# Patient Record
Sex: Male | Born: 1980 | Hispanic: No | Marital: Single | Smoking: Never smoker
Health system: Southern US, Community
[De-identification: ages and names within clinical notes are randomized; demographics above are authoritative.]

---

## 2008-04-27 ENCOUNTER — Emergency Department (HOSPITAL_BASED_OUTPATIENT_CLINIC_OR_DEPARTMENT_OTHER): Admission: EM | Admit: 2008-04-27 | Discharge: 2008-04-27 | Payer: Self-pay | Admitting: Emergency Medicine

## 2008-07-30 ENCOUNTER — Emergency Department (HOSPITAL_BASED_OUTPATIENT_CLINIC_OR_DEPARTMENT_OTHER): Admission: EM | Admit: 2008-07-30 | Discharge: 2008-07-30 | Payer: Self-pay | Admitting: Emergency Medicine

## 2011-07-08 LAB — DIFFERENTIAL
Basophils Relative: 1
Eosinophils Absolute: 0
Eosinophils Relative: 0
Lymphocytes Relative: 7 — ABNORMAL LOW
Lymphs Abs: 1.3
Monocytes Relative: 5

## 2011-07-08 LAB — COMPREHENSIVE METABOLIC PANEL
ALT: 12
CO2: 27
Calcium: 9.7
Creatinine, Ser: 1.2
GFR calc Af Amer: >60
Glucose, Bld: 126 — ABNORMAL HIGH
Total Bilirubin: 0.7
Total Protein: 8.7 — ABNORMAL HIGH

## 2011-07-08 LAB — CBC
HCT: 46.6
MCHC: 33.3
RBC: 5.94 — ABNORMAL HIGH
RDW: 12.9
WBC: 19 — ABNORMAL HIGH

## 2011-07-08 LAB — LIPASE, BLOOD: Lipase: 28

## 2011-08-02 ENCOUNTER — Encounter: Payer: Self-pay | Admitting: *Deleted

## 2011-08-02 ENCOUNTER — Emergency Department (HOSPITAL_BASED_OUTPATIENT_CLINIC_OR_DEPARTMENT_OTHER)
Admission: EM | Admit: 2011-08-02 | Discharge: 2011-08-02 | Disposition: A | Discharge status: Home / Self Care | Payer: Self-pay | Attending: Emergency Medicine | Admitting: Emergency Medicine

## 2011-08-02 ENCOUNTER — Emergency Department (INDEPENDENT_AMBULATORY_CARE_PROVIDER_SITE_OTHER): Payer: Self-pay

## 2011-08-02 DIAGNOSIS — M79609 Pain in unspecified limb: Secondary | ICD-10-CM

## 2011-08-02 DIAGNOSIS — Y9369 Activity, other involving other sports and athletics played as a team or group: Secondary | ICD-10-CM | POA: Insufficient documentation

## 2011-08-02 DIAGNOSIS — S61412A Laceration without foreign body of left hand, initial encounter: Secondary | ICD-10-CM

## 2011-08-02 DIAGNOSIS — W219XXA Striking against or struck by unspecified sports equipment, initial encounter: Secondary | ICD-10-CM | POA: Insufficient documentation

## 2011-08-02 DIAGNOSIS — X58XXXA Exposure to other specified factors, initial encounter: Secondary | ICD-10-CM

## 2011-08-02 DIAGNOSIS — S61409A Unspecified open wound of unspecified hand, initial encounter: Secondary | ICD-10-CM

## 2011-08-02 MED ORDER — TETANUS-DIPHTH-ACELL PERTUSSIS 5-2.5-18.5 LF-MCG/0.5 IM SUSP
0.5000 mL | Freq: Once | INTRAMUSCULAR | Status: AC
  Administered 2011-08-02: 0.5 mL via INTRAMUSCULAR
  Filled 2011-08-02: qty 0.5

## 2011-08-02 MED ORDER — TETANUS-DIPHTH-ACELL PERTUSSIS 5-2.5-18.5 LF-MCG/0.5 IM SUSP: 0.5000 mL | Freq: Once | INTRAMUSCULAR | Status: DC

## 2011-08-02 MED ORDER — LIDOCAINE HCL 2 % IJ SOLN
INTRAMUSCULAR | Status: AC
  Administered 2011-08-02: 400 mg
  Filled 2011-08-02: qty 1

## 2011-08-02 MED ORDER — LIDOCAINE HCL 2 % IJ SOLN
20.0000 mL | Freq: Once | INTRAMUSCULAR | Status: AC
  Administered 2011-08-02: 400 mg

## 2011-08-02 MED ORDER — HYDROCODONE-ACETAMINOPHEN 5-325 MG PO TABS: 2.0000 | ORAL_TABLET | ORAL | Status: AC | PRN

## 2011-08-02 NOTE — Discharge Instructions (Signed)
Sutured Wound Care Sutures are stitches that can be used to close wounds. Wound care helps prevent pain and infection.  HOME CARE INSTRUCTIONS   Rest and elevate the injured area until all the pain and swelling are gone.   Only take over-the-counter or prescription medicines for pain, discomfort, or fever as directed by your caregiver.   After 48 hours, gently wash the area with mild soap and water once a day, or as directed. Rinse off the soap. Pat the area dry with a clean towel. Do not rub the wound. This may cause bleeding.   Follow your caregiver's instructions for how often to change the bandage (dressing). Stop using a dressing after 2 days or after the wound stops draining.   If the dressing sticks, moisten it with soapy water and gently remove it.   Apply ointment on the wound as directed.   Avoid stretching a sutured wound.   Drink enough fluids to keep your urine clear or pale yellow.   Follow up with your caregiver for suture removal as directed.   Use sunscreen on your wound for the next 3 to 6 months so the scar will not darken.  SEEK IMMEDIATE MEDICAL CARE IF:   Your wound becomes red, swollen, hot, or tender.   You have increasing pain in the wound.   You have a red streak that extends from the wound.   There is pus coming from the wound.   You have a fever.   You have shaking chills.   There is a bad smell coming from the wound.   You have persistent bleeding from the wound.  MAKE SURE YOU:   Understand these instructions.   Will watch your condition.   Will get help right away if you are not doing well or get worse.  Document Released: 11/03/2004 Document Revised: 06/08/2011 Document Reviewed: 01/30/2011 [facility] Patient Information 2012 [place], [place].

## 2011-08-02 NOTE — ED Provider Notes (Signed)
History     CSN: [id number] Arrival date & time: 08/02/2011  5:53 PM   First MD Initiated Contact with Patient 08/02/11 1817      Chief Complaint  Patient presents with  . Laceration    (Consider location/radiation/quality/duration/timing/severity/associated sxs/prior treatment) Patient is a 30 y.o. male presenting with skin laceration. The history is provided by the patient. No language interpreter was used.  Laceration  The incident occurred 6 to 12 hours ago. The laceration is located on the left hand. The laceration is 2 cm in size. The laceration mechanism is unknown.The pain is at a severity of 5/10. The pain is mild. The pain has been constant since onset. He reports no foreign bodies present. His tetanus status is out of date.  Pt was hit with a cricket ball,  Pt complains of laeration to hand.  History reviewed. No pertinent past medical history.  History reviewed. No pertinent past surgical history.  History reviewed. No pertinent family history.  History  Substance Use Topics  . Smoking status: Never Smoker   . Smokeless tobacco: Not on file  . Alcohol Use: No      Review of Systems  Skin: Positive for wound.  All other systems reviewed and are negative.    Allergies  Review of patient's allergies indicates no known allergies.  Home Medications  No current outpatient prescriptions on file.  BP 113/84  Pulse 109  Temp(Src) 98.4 F (36.9 C) (Oral)  Resp 16  Ht 5\' 4"  (1.626 m)  Wt 150 lb (68.04 kg)  BMI 25.75 kg/m2  SpO2 100%  Physical Exam  Nursing note and vitals reviewed. Constitutional: He is oriented to person, place, and time. He appears well-developed and well-nourished.  HENT:  Head: Normocephalic and atraumatic.  Musculoskeletal:       2cm laceration between 2nd and 3rd finger  Neurological: He is alert and oriented to person, place, and time.  Skin: Skin is warm.  Psychiatric: He has a normal mood and affect.    ED Course  Wound  repair Date/Time: 08/02/2011 8:17 PM Performed by: Langston Masker Authorized by: Langston Masker Consent: Verbal consent obtained. Consent given by: patient Patient understanding: patient states understanding of the procedure being performed Patient identity confirmed: verbally with patient Time out: Immediately prior to procedure a "time out" was called to verify the correct patient, procedure, equipment, support staff and site/side marked as required. Local anesthesia used: yes Anesthesia: local infiltration Local anesthetic: lidocaine 1% without epinephrine Anesthetic total: 5 ml Patient sedated: no Patient tolerance: Patient tolerated the procedure well with no immediate complications.  LACERATION REPAIR Date/Time: 08/02/2011 8:20 PM Performed by: Langston Masker Authorized by: Langston Masker Consent: Verbal consent obtained. Risks and benefits: risks, benefits and alternatives were discussed Consent given by: patient Patient understanding: patient states understanding of the procedure being performed Patient identity confirmed: verbally with patient Time out: Immediately prior to procedure a "time out" was called to verify the correct patient, procedure, equipment, support staff and site/side marked as required. Body area: upper extremity Location details: left hand Laceration length: 2 cm Tendon involvement: none Nerve involvement: none Anesthesia: local infiltration Local anesthetic: lidocaine 1% without epinephrine Anesthetic total: 5 ml Patient sedated: no Preparation: Patient was prepped and draped in the usual sterile fashion. Irrigation solution: saline Amount of cleaning: standard Debridement: none Degree of undermining: none Skin closure: 5-0 nylon Number of sutures: 5 Technique: simple Approximation: loose Approximation difficulty: simple Patient tolerance: Patient tolerated the procedure well with no immediate  complications.   (including critical care  time)  Labs Reviewed - No data to display Dg Hand Complete Left  08/02/2011  *RADIOLOGY REPORT*  Clinical Data: Laceration to left hand between index and third finger.  LEFT HAND - COMPLETE 3+ VIEW  Comparison: None.  Findings: Soft tissue injury about the proximal aspect of the third digit. No radio-opaque foreign body.    No acute fracture or dislocation.  Mild overlap of fingers on the lateral view.  IMPRESSION: Soft tissue injury, without acute osseous abnormality.  Original Report Authenticated By: Consuello Bossier, M.D.     No diagnosis found.    MDM  Pt counseled on suture removal.        Langston Masker, PA 08/02/11 2022  Langston Masker, [place] 08/02/11 2023

## 2011-08-02 NOTE — ED Notes (Signed)
Wound cleaned, bacitracin applied then covered with non-adhering gauze and 4x4's then wrapped with kerlex.

## 2011-08-02 NOTE — ED Notes (Signed)
Pt states laceration to left hand b/w index and 3rd finger  By baseball

## 2011-08-05 NOTE — ED Provider Notes (Signed)
Medical screening examination/treatment/procedure(s) were performed by non-physician practitioner and as supervising physician I was immediately available for consultation/collaboration.  Toy Baker, MD 08/05/11 0930

## 2011-08-05 NOTE — ED Provider Notes (Signed)
Medical screening examination/treatment/procedure(s) were performed by non-physician practitioner and as supervising physician I was immediately available for consultation/collaboration.  Toy Baker, MD 08/05/11 [phone]

## 2011-12-19 ENCOUNTER — Emergency Department (HOSPITAL_BASED_OUTPATIENT_CLINIC_OR_DEPARTMENT_OTHER)
Admission: EM | Admit: 2011-12-19 | Discharge: 2011-12-19 | Disposition: A | Discharge status: Home / Self Care | Payer: Self-pay | Attending: Emergency Medicine | Admitting: Emergency Medicine

## 2011-12-19 ENCOUNTER — Encounter (HOSPITAL_BASED_OUTPATIENT_CLINIC_OR_DEPARTMENT_OTHER): Payer: Self-pay

## 2011-12-19 DIAGNOSIS — R05 Cough: Secondary | ICD-10-CM | POA: Insufficient documentation

## 2011-12-19 DIAGNOSIS — R059 Cough, unspecified: Secondary | ICD-10-CM | POA: Insufficient documentation

## 2011-12-19 DIAGNOSIS — J069 Acute upper respiratory infection, unspecified: Secondary | ICD-10-CM

## 2011-12-19 DIAGNOSIS — R509 Fever, unspecified: Secondary | ICD-10-CM | POA: Insufficient documentation

## 2011-12-19 NOTE — ED Provider Notes (Signed)
History     CSN: [id number]  Arrival date & time 12/19/11  1904   First MD Initiated Contact with Patient 12/19/11 1932      Chief Complaint  Patient presents with  . Fever  . Cough     HPI   Philip Burke is a 31 y.o. male who presents to the Emergency Department complaining of sudden onset, persistence of constant, gradually worsening moderate non-prod  cough onset yesterday. Pt c/o associated subjective fever, temp is currently 98.7.   Pt c/o associated mild sore throat and nasal congestion. Pt denies taking any medications to treat sx. Denies vomiting, diarrhea. There are no other associated symptoms and no other alleviating or aggravating factors.  Pt denies any recent foreign travel   PMH - none  History reviewed. No pertinent past surgical history.  No family history on file.  History  Substance Use Topics  . Smoking status: Never Smoker   . Smokeless tobacco: Not on file  . Alcohol Use: No      Review of Systems  HENT: Positive for congestion, sore throat and rhinorrhea.   Respiratory: Positive for cough.   Gastrointestinal: Negative for nausea, vomiting and diarrhea.    Allergies  Review of patient's allergies indicates no known allergies.  Home Medications  No current outpatient prescriptions on file.  BP 121/74  Pulse 82  Temp(Src) 98.7 F (37.1 C) (Oral)  Resp 16  Ht 5\' 6"  (1.676 m)  Wt 120 lb (54.432 kg)  BMI 19.37 kg/m2  SpO2 98%  Physical Exam CONSTITUTIONAL: Well developed/well nourished HEAD AND FACE: Normocephalic/atraumatic EYES: EOMI/PERRL ENMT: Mucous membranes moist, uvula midline, pharynx erythematous NECK: supple no meningeal signs CV: S1/S2 noted, no murmurs/rubs/gallops noted LUNGS: Lungs are clear to auscultation bilaterally, no apparent distress, pharynx erythematous  ABDOMEN: soft, nontender, no rebound or guarding GU:no cva tenderness NEURO: Pt is awake/alert, moves all extremitiesx4 EXTREMITIES: pulses normal, full  ROM SKIN: warm, color normal PSYCH: no abnormalities of mood noted  ED Course  Procedures DIAGNOSTIC STUDIES: Oxygen Saturation is 98% on room air, normal by my interpretation.    COORDINATION OF CARE:    Pt well appearing, no distress Suspect viral process  The patient appears reasonably screened and/or stabilized for discharge and I doubt any other medical condition or other [facility] requiring further screening, evaluation, or treatment in the ED at this time prior to discharge.    MDM  Nursing notes reviewed and considered in documentation   I personally performed the services described in this documentation, which was scribed in my presence. The recorded information has been reviewed and considered.       Joya Gaskins, MD 12/19/11 2019

## 2011-12-19 NOTE — Discharge Instructions (Signed)
Antibiotic Nonuse  Your caregiver felt that the infection or problem was not one that would be helped with an antibiotic. Infections may be caused by viruses or bacteria. Only a caregiver can tell which one of these is the likely cause of an illness. A cold is the most common cause of infection in both adults and children. A cold is a virus. Antibiotic treatment will have no effect on a viral infection. Viruses can lead to many lost days of work caring for sick children and many missed days of school. Children may catch as many as 10 "colds" or "flus" per year during which they can be tearful, cranky, and uncomfortable. The goal of treating a virus is aimed at keeping the ill person comfortable. Antibiotics are medications used to help the body fight bacterial infections. There are relatively few types of bacteria that cause infections but there are hundreds of viruses. While both viruses and bacteria cause infection they are very different types of germs. A viral infection will typically go away by itself within 7 to 10 days. Bacterial infections may spread or get worse without antibiotic treatment. Examples of bacterial infections are:  Sore throats (like strep throat or tonsillitis).   Infection in the lung (pneumonia).   Ear and skin infections.  Examples of viral infections are:  Colds or flus.   Most coughs and bronchitis.   Sore throats not caused by Strep.   Runny noses.  It is often best not to take an antibiotic when a viral infection is the cause of the problem. Antibiotics can kill off the helpful bacteria that we have inside our body and allow harmful bacteria to start growing. Antibiotics can cause side effects such as allergies, nausea, and diarrhea without helping to improve the symptoms of the viral infection. Additionally, repeated uses of antibiotics can cause bacteria inside of our body to become resistant. That resistance can be passed onto harmful bacterial. The next time  you have an infection it may be harder to treat if antibiotics are used when they are not needed. Not treating with antibiotics allows our own immune system to develop and take care of infections more efficiently. Also, antibiotics will work better for us when they are prescribed for bacterial infections. Treatments for a child that is ill may include:  Give extra fluids throughout the day to stay hydrated.   Get plenty of rest.   Only give your child over-the-counter or prescription medicines for pain, discomfort, or fever as directed by your caregiver.   The use of a cool mist humidifier may help stuffy noses.   Cold medications if suggested by your caregiver.  Your caregiver may decide to start you on an antibiotic if:  The problem you were seen for today continues for a longer length of time than expected.   You develop a secondary bacterial infection.  SEEK MEDICAL CARE IF:  Fever lasts longer than 5 days.   Symptoms continue to get worse after 5 to 7 days or become severe.   Difficulty in breathing develops.   Signs of dehydration develop (poor drinking, rare urinating, dark colored urine).   Changes in behavior or worsening tiredness (listlessness or lethargy).  Document Released: 12/05/2001 Document Revised: 09/15/2011 Document Reviewed: 06/03/2009 [employer] Patient Information 2012 [employer]. 

## 2011-12-19 NOTE — ED Notes (Signed)
Fever, nonprod cough-started yesterday-NAD

## 2013-12-31 ENCOUNTER — Emergency Department (HOSPITAL_BASED_OUTPATIENT_CLINIC_OR_DEPARTMENT_OTHER)
Admission: EM | Admit: 2013-12-31 | Discharge: 2013-12-31 | Disposition: A | Discharge status: Home / Self Care | Payer: Self-pay | Attending: Emergency Medicine | Admitting: Emergency Medicine

## 2013-12-31 ENCOUNTER — Encounter (HOSPITAL_BASED_OUTPATIENT_CLINIC_OR_DEPARTMENT_OTHER): Payer: Self-pay | Admitting: Emergency Medicine

## 2013-12-31 DIAGNOSIS — W219XXA Striking against or struck by unspecified sports equipment, initial encounter: Secondary | ICD-10-CM | POA: Insufficient documentation

## 2013-12-31 DIAGNOSIS — S01511A Laceration without foreign body of lip, initial encounter: Secondary | ICD-10-CM

## 2013-12-31 DIAGNOSIS — Y9369 Activity, other involving other sports and athletics played as a team or group: Secondary | ICD-10-CM | POA: Insufficient documentation

## 2013-12-31 DIAGNOSIS — Y9239 Other specified sports and athletic area as the place of occurrence of the external cause: Secondary | ICD-10-CM | POA: Insufficient documentation

## 2013-12-31 DIAGNOSIS — Y92838 Other recreation area as the place of occurrence of the external cause: Secondary | ICD-10-CM

## 2013-12-31 DIAGNOSIS — S01501A Unspecified open wound of lip, initial encounter: Secondary | ICD-10-CM | POA: Insufficient documentation

## 2013-12-31 MED ORDER — AMOXICILLIN 500 MG PO CAPS: 500.0000 mg | ORAL_CAPSULE | Freq: Three times a day (TID) | ORAL | Status: DC

## 2013-12-31 NOTE — ED Provider Notes (Signed)
CSN: [id number]     Arrival date & time 12/31/13  1819 History   First MD Initiated Contact with Patient 12/31/13 1825     Chief Complaint  Patient presents with  . Lip Laceration     (Consider location/radiation/quality/duration/timing/severity/associated sxs/prior Treatment) HPI Comments: Pt was playing cricket a  Short time ago and the ball hit him in the lip and he has a laceration. Pt denies problems with opening and closing mouth. Tetanus is utd. Denies any dental problem  The history is provided by the patient. No language interpreter was used.    History reviewed. No pertinent past medical history. History reviewed. No pertinent past surgical history. No family history on file. History  Substance Use Topics  . Smoking status: Never Smoker   . Smokeless tobacco: Not on file  . Alcohol Use: No    Review of Systems  Constitutional: Negative.   Respiratory: Negative.   Cardiovascular: Negative.       Allergies  Review of patient's allergies indicates no known allergies.  Home Medications  No current outpatient prescriptions on file. BP 119/70  Pulse 96  Temp(Src) 98.4 F (36.9 C) (Oral)  Resp 18  Ht 5\' 8"  (1.727 m)  Wt 120 lb (54.432 kg)  BMI 18.25 kg/m2  SpO2 98% Physical Exam  Nursing note and vitals reviewed. Constitutional: He is oriented to person, place, and time. He appears well-developed and well-nourished.  HENT:  Right Ear: External ear normal.  Left Ear: External ear normal.  Pt no swelling or tenderness noted to the jaw.pt opening and closing without any problem:dentition intact  Cardiovascular: Normal rate and regular rhythm.   Pulmonary/Chest: Effort normal and breath sounds normal.  Musculoskeletal: Normal range of motion.  Neurological: He is alert and oriented to person, place, and time.  Skin:  Laceration noted to lower lip that is irregular    ED Course  LACERATION REPAIR Date/Time: 12/31/2013 7:01 PM Performed by: Teressa LowerPICKERING,  Juanantonio Stolar Authorized by: Teressa LowerPICKERING, Shali Vesey Consent: Verbal consent obtained. Risks and benefits: risks, benefits and alternatives were discussed Consent given by: patient Patient identity confirmed: verbally with patient Body area: mouth (lower lip partially outer and partial inner mucosa) Laceration length: 2 cm Foreign bodies: no foreign bodies Anesthesia: local infiltration Local anesthetic: lidocaine 2% without epinephrine Irrigation solution: saline Irrigation method: syringe Amount of cleaning: standard Skin closure: Ethilon Number of sutures: 6 Technique: simple Approximation: close Approximation difficulty: complex Patient tolerance: Patient tolerated the procedure well with no immediate complications.   (including critical care time) Labs Review Labs Reviewed - No data to display Imaging Review No results found.   EKG Interpretation None      MDM   Final diagnoses:  Lip laceration    Wound closed. Some sutures put on the inside do to complexity of the wound. Pt placed on antibiotics for risk of infection    Teressa LowerVrinda Wynston Romey, NP 12/31/13 1904

## 2013-12-31 NOTE — ED Notes (Signed)
Pt playing cricket, hit by a ball in the mouth, sustained lip laceration.  No loc.

## 2013-12-31 NOTE — ED Notes (Signed)
Suture cart to bedside. 

## 2013-12-31 NOTE — ED Provider Notes (Signed)
Medical screening examination/treatment/procedure(s) were performed by non-physician practitioner and as supervising physician I was immediately available for consultation/collaboration.   EKG Interpretation None        Philip B. Bernette MayersSheldon, MD 12/31/13 2030

## 2013-12-31 NOTE — Discharge Instructions (Signed)
Follow up for any sign of infection Facial Laceration  A facial laceration is a cut on the face. These injuries can be painful and cause bleeding. Lacerations usually heal quickly, but they need special care to reduce scarring. DIAGNOSIS  Your health care provider will take a medical history, ask for details about how the injury occurred, and examine the wound to determine how deep the cut is. TREATMENT  Some facial lacerations may not require closure. Others may not be able to be closed because of an increased risk of infection. The risk of infection and the chance for successful closure will depend on various factors, including the amount of time since the injury occurred. The wound may be cleaned to help prevent infection. If closure is appropriate, pain medicines may be given if needed. Your health care provider will use stitches (sutures), wound glue (adhesive), or skin adhesive strips to repair the laceration. These tools bring the skin edges together to allow for faster healing and a better cosmetic outcome. If needed, you may also be given a tetanus shot. HOME CARE INSTRUCTIONS  Only take over-the-counter or prescription medicines as directed by your health care provider.  Follow your health care provider's instructions for wound care. These instructions will vary depending on the technique used for closing the wound. For Sutures:  Keep the wound clean and dry.   If you were given a bandage (dressing), you should change it at least once a day. Also change the dressing if it becomes wet or dirty, or as directed by your health care provider.   Wash the wound with soap and water 2 times a day. Rinse the wound off with water to remove all soap. Pat the wound dry with a clean towel.   After cleaning, apply a thin layer of the antibiotic ointment recommended by your health care provider. This will help prevent infection and keep the dressing from sticking.   You may shower as usual after  the first 24 hours. Do not soak the wound in water until the sutures are removed.   Get your sutures removed as directed by your health care provider. With facial lacerations, sutures should usually be taken out after 4 5 days to avoid stitch marks.   Wait a few days after your sutures are removed before applying any makeup. For Skin Adhesive Strips:  Keep the wound clean and dry.   Do not get the skin adhesive strips wet. You may bathe carefully, using caution to keep the wound dry.   If the wound gets wet, pat it dry with a clean towel.   Skin adhesive strips will fall off on their own. You may trim the strips as the wound heals. Do not remove skin adhesive strips that are still stuck to the wound. They will fall off in time.  For Wound Adhesive:  You may briefly wet your wound in the shower or bath. Do not soak or scrub the wound. Do not swim. Avoid periods of heavy sweating until the skin adhesive has fallen off on its own. After showering or bathing, gently pat the wound dry with a clean towel.   Do not apply liquid medicine, cream medicine, ointment medicine, or makeup to your wound while the skin adhesive is in place. This may loosen the film before your wound is healed.   If a dressing is placed over the wound, be careful not to apply tape directly over the skin adhesive. This may cause the adhesive to be pulled off  before the wound is healed.   Avoid prolonged exposure to sunlight or tanning lamps while the skin adhesive is in place.  The skin adhesive will usually remain in place for 5 10 days, then naturally fall off the skin. Do not pick at the adhesive film.  After Healing: Once the wound has healed, cover the wound with sunscreen during the day for 1 full year. This can help minimize scarring. Exposure to ultraviolet light in the first year will darken the scar. It can take 1 2 years for the scar to lose its redness and to heal completely.  SEEK IMMEDIATE MEDICAL  CARE IF:  You have redness, pain, or swelling around the wound.   You see ayellowish-white fluid (pus) coming from the wound.   You have chills or a fever.  MAKE SURE YOU:  Understand these instructions.  Will watch your condition.  Will get help right away if you are not doing well or get worse. Document Released: 11/03/2004 Document Revised: 07/17/2013 Document Reviewed: 05/09/2013 [facility] Patient Information 2014 [place] Name[employer], [place].  Absorbable Suture Repair Absorbable sutures (stitches) hold skin together so you can heal. Keep skin wounds clean and dry for the next 2 to 3 days. Then, you may gently wash your wound and dress it with an antibiotic ointment as recommended. As your wound begins to heal, the sutures are no longer needed, and they typically begin to fall off. This will take 7 to 10 days. After 10 days, if your sutures are loose, you can remove them by wiping with a clean gauze pad or a cotton ball. Do not pull your sutures out. They should wipe away easily. If after 10 days they do not easily wipe away, have your caregiver take them out. Absorbable sutures may be used deep in a wound to help hold it together. If these stitches are below the skin, the body will absorb them completely in 3 to 4 weeks.  You may need a tetanus shot if:  You cannot remember when you had your last tetanus shot.  You have never had a tetanus shot. If you get a tetanus shot, your arm may swell, get red, and feel warm to the touch. This is common and not a problem. If you need a tetanus shot and you choose not to have one, there is a rare chance of getting tetanus. Sickness from tetanus can be serious. SEEK IMMEDIATE MEDICAL CARE IF:  You have redness in the wound area.  The wound area feels hot to the touch.  You develop swelling in the wound area.  You develop pain.  There is fluid drainage from the wound. Document Released: 11/03/2004 Document Revised: 12/19/2011 Document Reviewed:  02/15/2011 [facility] Patient Information 2014 [place]Care, [place].

## 2014-04-30 ENCOUNTER — Emergency Department (HOSPITAL_BASED_OUTPATIENT_CLINIC_OR_DEPARTMENT_OTHER)
Admission: EM | Admit: 2014-04-30 | Discharge: 2014-04-30 | Disposition: A | Discharge status: Home / Self Care | Payer: Self-pay | Attending: Emergency Medicine | Admitting: Emergency Medicine

## 2014-04-30 ENCOUNTER — Encounter (HOSPITAL_BASED_OUTPATIENT_CLINIC_OR_DEPARTMENT_OTHER): Payer: Self-pay | Admitting: Emergency Medicine

## 2014-04-30 ENCOUNTER — Emergency Department (HOSPITAL_BASED_OUTPATIENT_CLINIC_OR_DEPARTMENT_OTHER): Payer: Self-pay

## 2014-04-30 DIAGNOSIS — S63259A Unspecified dislocation of unspecified finger, initial encounter: Secondary | ICD-10-CM | POA: Insufficient documentation

## 2014-04-30 DIAGNOSIS — W219XXA Striking against or struck by unspecified sports equipment, initial encounter: Secondary | ICD-10-CM | POA: Insufficient documentation

## 2014-04-30 DIAGNOSIS — S63105A Unspecified dislocation of left thumb, initial encounter: Secondary | ICD-10-CM

## 2014-04-30 DIAGNOSIS — Y9289 Other specified places as the place of occurrence of the external cause: Secondary | ICD-10-CM | POA: Insufficient documentation

## 2014-04-30 DIAGNOSIS — Y9389 Activity, other specified: Secondary | ICD-10-CM | POA: Insufficient documentation

## 2014-04-30 MED ORDER — HYDROCODONE-ACETAMINOPHEN 5-325 MG PO TABS: 2.0000 | ORAL_TABLET | ORAL | Status: DC | PRN

## 2014-04-30 MED ORDER — LIDOCAINE HCL 2 % IJ SOLN
INTRAMUSCULAR | Status: AC
  Administered 2014-04-30: 21:00:00
  Filled 2014-04-30: qty 20

## 2014-04-30 MED ORDER — HYDROCODONE-ACETAMINOPHEN 5-325 MG PO TABS
2.0000 | ORAL_TABLET | Freq: Once | ORAL | Status: AC
  Administered 2014-04-30: 2 via ORAL
  Filled 2014-04-30: qty 2

## 2014-04-30 NOTE — ED Notes (Signed)
Pt c/o injury to left thumb x 1 hr ago while playing ball. deformity noted

## 2014-04-30 NOTE — ED Provider Notes (Signed)
CSN: [id number]     Arrival date & time 04/30/14  1924 History   First MD Initiated Contact with Patient 04/30/14 2040     Chief Complaint  Patient presents with  . Finger Injury     (Consider location/radiation/quality/duration/timing/severity/associated sxs/prior Treatment) HPI Comments: Patient is a 33 year old male who presents with left thumb pain that started 1 hour ago while playing cricket. Patient reports trying to catch the ball when it hit his thumb. He reports sudden onset of throbbing and severe pain that did not radiate. Palpation makes the pain worse. No alleviating factors. Patient denies any other injuries or symptoms.    History reviewed. No pertinent past medical history. History reviewed. No pertinent past surgical history. History reviewed. No pertinent family history. History  Substance Use Topics  . Smoking status: Never Smoker   . Smokeless tobacco: Not on file  . Alcohol Use: No    Review of Systems  Constitutional: Negative for fever, chills and fatigue.  HENT: Negative for trouble swallowing.   Eyes: Negative for visual disturbance.  Respiratory: Negative for shortness of breath.   Cardiovascular: Negative for chest pain and palpitations.  Gastrointestinal: Negative for nausea, vomiting, abdominal pain and diarrhea.  Genitourinary: Negative for dysuria and difficulty urinating.  Musculoskeletal: Positive for arthralgias. Negative for neck pain.  Skin: Negative for color change.  Neurological: Negative for dizziness and weakness.  Psychiatric/Behavioral: Negative for dysphoric mood.      Allergies  Review of patient's allergies indicates no known allergies.  Home Medications   Prior to Admission medications   Not on File   BP 122/85  Pulse 88  Temp(Src) 98.9 F (37.2 C) (Oral)  Resp 18  Ht 5\' 8"  (1.727 m)  Wt 121 lb (54.885 kg)  BMI 18.40 kg/m2  SpO2 100% Physical Exam  Nursing note and vitals reviewed. Constitutional: He is oriented  to person, place, and time. He appears well-developed and well-nourished. No distress.  HENT:  Head: Normocephalic and atraumatic.  Eyes: Conjunctivae are normal.  Neck: Normal range of motion.  Cardiovascular: Normal rate and regular rhythm.  Exam reveals no gallop and no friction rub.   No murmur heard. Pulmonary/Chest: Effort normal and breath sounds normal. He has no wheezes. He has no rales. He exhibits no tenderness.  Musculoskeletal:  Obvious deformity of DIP of left thumb. Limited ROM of left thumb due to deformity and pain.   Neurological: He is alert and oriented to person, place, and time. Coordination normal.  Speech is goal-oriented. Moves limbs without ataxia.   Skin: Skin is warm and dry.  Psychiatric: He has a normal mood and affect. His behavior is normal.    ED Course  NERVE BLOCK Date/Time: 04/30/2014 9:00 PM Performed by: Emilia BeckSZEKALSKI, Thao Vanover Authorized by: Emilia BeckSZEKALSKI, Tyrea Froberg Consent: Verbal consent obtained. Consent given by: patient Patient understanding: patient states understanding of the procedure being performed Patient consent: the patient's understanding of the procedure matches consent given Patient identity confirmed: verbally with patient Indications: dislocation Body area: upper extremity Nerve: digital Laterality: left Patient sedated: no Patient position: sitting Needle gauge: 27 G Location technique: anatomical landmarks Local anesthetic: lidocaine 2% without epinephrine Anesthetic total: 3 ml Outcome: pain improved Patient tolerance: Patient tolerated the procedure well with no immediate complications.  Reduction of dislocation Date/Time: 04/30/2014 9:01 PM Performed by: Emilia BeckSZEKALSKI, Eliasar Hlavaty Authorized by: Emilia BeckSZEKALSKI, Jozsef Wescoat Consent: Verbal consent obtained. Risks and benefits: risks, benefits and alternatives were discussed Consent given by: patient Patient understanding: patient states understanding of the  procedure being  performed Patient consent: the patient's understanding of the procedure matches consent given Imaging studies: imaging studies available Patient identity confirmed: verbally with patient Local anesthesia used: yes Anesthesia: digital block Local anesthetic: lidocaine 2% without epinephrine Anesthetic total: 3 ml Patient sedated: no Patient tolerance: Patient tolerated the procedure well with no immediate complications.   (including critical care time) Labs Review Labs Reviewed - No data to display  SPLINT APPLICATION Date/Time: 9:28 PM Authorized by: Emilia Beck Consent: Verbal consent obtained. Risks and benefits: risks, benefits and alternatives were discussed Consent given by: patient Splint applied by: EMT Location details: left thumb Splint type: static finger splint Supplies used: static finger splint Post-procedure: The splinted body part was neurovascularly unchanged following the procedure. Patient tolerance: Patient tolerated the procedure well with no immediate complications.     Imaging Review Dg Finger Thumb Left  04/30/2014   CLINICAL DATA:  Postreduction.  EXAM: LEFT THUMB 2+V  COMPARISON:  Earlier the same day  FINDINGS: Be IP joint dislocation seen on the previous study is been reduced in the interval. No fracture is evident on the current exam.  IMPRESSION: Interval reduction of the IP joint dislocation.   Electronically Signed   By: Kennith Center M.D.   On: 04/30/2014 21:15   Dg Finger Thumb Left  04/30/2014   CLINICAL DATA:  Left thumb injury, pain.  EXAM: LEFT THUMB 2+V  COMPARISON:  None.  FINDINGS: The distal phalanx of the left thumb is dorsally dislocated. No fracture is identified. Soft tissue swelling is noted.  IMPRESSION: Dorsal dislocation distal phalanx left thumb.   Electronically Signed   By: Drusilla Kanner M.D.   On: 04/30/2014 20:11     EKG Interpretation None      MDM   Final diagnoses:  Thumb dislocation, left, initial  encounter    8:57 PM Patient's dislocation reduced. Will get xray to confirm. Patient will have vicodin for pain and finger splint.   9:27 PM Dislocation reduced without difficulty. No neurovascular compromise. Patient will have splint and be discharged with hand surgery follow up.   Emilia Beck, PA-C 04/30/14 2132

## 2014-04-30 NOTE — Discharge Instructions (Signed)
Wear finger splint until follow up with hand surgeon. Call Dr. Carlos LeveringGramig's office to make an appointment. Take Vicodin as needed for pain. Refer to attached documents for more information.

## 2014-04-30 NOTE — ED Provider Notes (Signed)
Medical screening examination/treatment/procedure(s) were performed by non-physician practitioner and as supervising physician I was immediately available for consultation/collaboration.   EKG Interpretation None        William Lateria Alderman, MD 04/30/14 2331 

## 2015-11-19 ENCOUNTER — Emergency Department (HOSPITAL_BASED_OUTPATIENT_CLINIC_OR_DEPARTMENT_OTHER): Payer: Self-pay

## 2015-11-19 ENCOUNTER — Encounter (HOSPITAL_BASED_OUTPATIENT_CLINIC_OR_DEPARTMENT_OTHER): Payer: Self-pay | Admitting: *Deleted

## 2015-11-19 ENCOUNTER — Emergency Department (HOSPITAL_BASED_OUTPATIENT_CLINIC_OR_DEPARTMENT_OTHER)
Admission: EM | Admit: 2015-11-19 | Discharge: 2015-11-20 | Disposition: A | Discharge status: Home / Self Care | Payer: Self-pay | Attending: Emergency Medicine | Admitting: Emergency Medicine

## 2015-11-19 DIAGNOSIS — B349 Viral infection, unspecified: Secondary | ICD-10-CM | POA: Insufficient documentation

## 2015-11-19 MED ORDER — ACETAMINOPHEN 325 MG PO TABS
650.0000 mg | ORAL_TABLET | Freq: Once | ORAL | Status: AC
  Administered 2015-11-19: 650 mg via ORAL
  Filled 2015-11-19: qty 2

## 2015-11-19 NOTE — ED Notes (Signed)
Cough, fever, headache and runny nose today.

## 2015-11-19 NOTE — ED Notes (Signed)
Pt c/o cough, congestion and fever that started today, has not taken any medication for symptoms

## 2015-11-20 MED ORDER — BENZONATATE 100 MG PO CAPS: 100.0000 mg | ORAL_CAPSULE | Freq: Three times a day (TID) | ORAL | Status: DC

## 2015-11-20 MED ORDER — IBUPROFEN 800 MG PO TABS: 800.0000 mg | ORAL_TABLET | Freq: Three times a day (TID) | ORAL | Status: DC | PRN

## 2015-11-20 NOTE — ED Notes (Signed)
Pt verbalizes understanding of d/c instructions and denies any further needs at this time. 

## 2015-11-20 NOTE — Discharge Instructions (Signed)
1. Medications: tessalon as needed for cough, ibuprofen as needed for fever (you can alternate with tylenol as well for fever control), continue usual home medications 2. Treatment: rest, drink plenty of fluids 3. Follow Up: Please follow up with your primary doctor in 3 days for discussion of your diagnoses and further evaluation after today's visit; if you do not have a primary care doctor use the resource guide provided to find one; Return to the ER for high fevers, difficulty breathing or other concerning symptoms   Emergency Department Resource Guide 1) Find a Doctor and Pay Out of Pocket Although you won't have to find out who is covered by your insurance plan, it is a good idea to ask around and get recommendations. You will then need to call the office and see if the doctor you have chosen will accept you as a new patient and what types of options they offer for patients who are self-pay. Some doctors offer discounts or will set up payment plans for their patients who do not have insurance, but you will need to ask so you aren't surprised when you get to your appointment.  2) Contact Your Local Health Department Not all health departments have doctors that can see patients for sick visits, but many do, so it is worth a call to see if yours does. If you don't know where your local health department is, you can check in your phone book. The CDC also has a tool to help you locate your state's health department, and many state websites also have listings of all of their local health departments.  3) Find a Walk-in Clinic If your illness is not likely to be very severe or complicated, you may want to try a walk in clinic. These are popping up all over the country in pharmacies, drugstores, and shopping centers. They're usually staffed by nurse practitioners or physician assistants that have been trained to treat common illnesses and complaints. They're usually fairly quick and inexpensive. However,  if you have serious medical issues or chronic medical problems, these are probably not your best option.  No Primary Care Doctor: - Call Health Connect at  [phone] - they can help you locate a primary care doctor that  accepts your insurance, provides certain services, etc. - Physician Referral Service- [phone]  Chronic Pain Problems: Organization         Address  Phone   Notes  [facility]  [phone] Patients need to be referred by their primary care doctor.   Medication Assistance: Organization         Address  Phone   Notes  [facility] [address] [place] [place], [place] [address] [phone] --Must be a resident of [facility] -- Must have NO insurance coverage whatsoever (no Medicaid/ Medicare, etc.) -- The pt. MUST have a primary care doctor that directs their care regularly and follows them in the community   [facility]  [phone]   [employer]  [phone]    Agencies that provide inexpensive medical care: Organization         Address  Phone   Notes  [facility]  [phone]   [facility]    [phone]   [facility] [address] [place], [place] [address] [phone]   [facility] [address], [place] [phone]   [facility]    [phone]   [facility]    [phone]   [facility]  [address], [place] Phone:  [phone], Fax:  [phone] Hours of Operation:  9 am - 6 pm, M-F.  Also accepts Medicaid/Medicare and self-pay.  [facility]  [address], [place] Phone: [phone], Fax: [phone]. Hours of Operation:  8:30 am - 5:30 pm, M-F.  Also accepts Medicaid and self-pay.  [facility] [place] [address] [place] Phone: [phone]   [facility] [address] Natasha Bence [place], [place] [phone], Ext. [phone] Mondays & Thursdays: 7-9 AM.  First 15 patients are seen on a first come, first serve basis.    Medicaid-accepting [facility] Providers:  Organization         Address  Phone   Notes  [facility] [address], [place] [phone] Also accepts self-pay patients.  [facility] [address] Laurell Josephs [place], [place]  [phone]   [facility] [address], [place] [phone]   [facility] [address], [place] [phone]   Renaye Rakers [address], [place]   [phone] Only accepts [place] patients after they have their name applied to their card.   Self-Pay (no insurance) in [facility]:  Organization         Address  Phone   Notes  Sickle Cell Patients, [facility] [address] [place], [place] [phone]   [facility] [address] [place], [place] [phone]   [facility] [place]  [address], [place] [phone]   [facility]/Dr. Osei-Bonsu  [address] [place] or [address], [place] [phone] Phone number for both [place] and [place] locations is the same.  [facility] [address], [place] [phone]   [facility] [address] [place] or [address] [phone]   [facility] [address] [place] [phone], phone; [phone], fax Sees patients 1st and 3rd Saturday of every month.  Must not qualify for public or private insurance (i.e. Medicaid, Medicare,  Health Choice, Veterans' Benefits)  Household income should be no more than 200% of the poverty level The clinic cannot treat you if you are pregnant or think you are  pregnant  Sexually transmitted diseases are not treated at the clinic.

## 2015-11-20 NOTE — ED Provider Notes (Signed)
CSN: [id number]     Arrival date & time 11/19/15  2246 History   First MD Initiated Contact with Patient 11/20/15 0028     Chief Complaint  Patient presents with  . Cough     (Consider location/radiation/quality/duration/timing/severity/associated sxs/prior Treatment) Patient is a 35 y.o. male presenting with cough. The history is provided by the patient and medical records. No language interpreter was used.  Cough Associated symptoms: fever, headaches and rhinorrhea   Associated symptoms: no rash, no shortness of breath, no sore throat and no wheezing    Jerrelle Michelsen is a 35 y.o. male  with no pertinent PMH who presents to the Emergency Department complaining of sudden onset of non-productive cough, runny nose, and subjective fever that began earlier today. Denies sick contacts. No medications taken PTA. Denies chest pain, shortness of breath, n/v/d.   History reviewed. No pertinent past medical history. History reviewed. No pertinent past surgical history. No family history on file. Social History  Substance Use Topics  . Smoking status: Never Smoker   . Smokeless tobacco: None  . Alcohol Use: No    Review of Systems  Constitutional: Positive for fever. Negative for fatigue.  HENT: Positive for rhinorrhea. Negative for sore throat.   Eyes: Negative for visual disturbance.  Respiratory: Positive for cough. Negative for shortness of breath and wheezing.   Cardiovascular: Negative.   Gastrointestinal: Negative for nausea, vomiting and abdominal pain.  Genitourinary: Negative for dysuria.  Musculoskeletal: Negative for back pain and neck pain.  Skin: Negative for rash.  Neurological: Positive for headaches. Negative for dizziness and weakness.      Allergies  Review of patient's allergies indicates no known allergies.  Home Medications   Prior to Admission medications   Medication Sig Start Date End Date Taking? Authorizing Provider  benzonatate (TESSALON) 100 MG  capsule Take 1 capsule (100 mg total) by mouth every 8 (eight) hours. 11/20/15   Chase Picket Theodore Virgin, PA-C  HYDROcodone-acetaminophen (NORCO/VICODIN) 5-325 MG per tablet Take 2 tablets by mouth every 4 (four) hours as needed for moderate pain or severe pain. 04/30/14   Kaitlyn Szekalski, PA-C  ibuprofen (ADVIL,MOTRIN) 800 MG tablet Take 1 tablet (800 mg total) by mouth every 8 (eight) hours as needed for fever. 11/20/15   Bernarda Erck Pilcher Wylma Tatem, PA-C   BP 127/85 mmHg  Pulse 96  Temp(Src) 99.9 F (37.7 C) (Oral)  Resp 18  Ht  (1.727 m)  Wt 53.524 kg  BMI 17.95 kg/m2  SpO2 98% Physical Exam  Constitutional: He is oriented to person, place, and time. He appears well-developed and well-nourished.  Alert and in no acute distress  HENT:  Head: Normocephalic and atraumatic.  OP with erythema, no exudates.  Neck: Normal range of motion. No Brudzinski's sign and no Kernig's sign noted.  Tender right anterior cervical LAD.  Cardiovascular: Normal rate, regular rhythm and normal heart sounds.  Exam reveals no gallop and no friction rub.   No murmur heard. Pulmonary/Chest: Effort normal and breath sounds normal. No respiratory distress. He has no wheezes. He has no rales.  Abdominal: Soft. Bowel sounds are normal. He exhibits no distension. There is no tenderness.  Musculoskeletal: He exhibits no edema.  Neurological: He is alert and oriented to person, place, and time.  Skin: Skin is warm and dry. No rash noted.  Psychiatric: He has a normal mood and affect. His behavior is normal. Judgment and [id number] normal.  Nursing note and vitals reviewed.   ED Course  Procedures (  including critical care time) Labs Review Labs Reviewed - No data to display  Imaging Review Dg Chest 2 View  11/20/2015  CLINICAL DATA:  Cough and congestion and fever. EXAM: CHEST  2 VIEW COMPARISON:  07/30/2008. FINDINGS: Normal heart size. No edema or consolidation. Chronic RIGHT hemidiaphragm elevation. No osseous  findings. IMPRESSION: No active cardiopulmonary disease. Electronically Signed   By: Elsie Stain M.D.   On: 11/20/2015 00:16   I have personally reviewed and evaluated these images and lab results as part of my medical decision-making.   EKG Interpretation None      MDM   Final diagnoses:  Viral syndrome   Kazuo Durnil presents with flu-like symptoms. Pt is febrile upon arrival with a clear lung exam. Rhinorrhea and OP with erythema, no exudates. Likely of viral etiology. Patient is agreeable to symptomatic treatment with close follow up with PCP (resource guide given) as needed but spoke at length about emergent, changing, or worsening of symptoms that should prompt return to ER. Patient voices understanding and is agreeable to plan.   Temperature re-checked in ED after tylenol and has improved.  Blood pressure 127/85, pulse 96, temperature 99.9 F (37.7 C), temperature source Oral, resp. rate 18, height  (1.727 m), weight 53.524 kg, SpO2 98 %.  [facility] Mirella Gueye, PA-C 11/20/15 0105  Paula Libra, MD 11/20/15 [phone]

## 2016-08-01 ENCOUNTER — Encounter (HOSPITAL_BASED_OUTPATIENT_CLINIC_OR_DEPARTMENT_OTHER): Payer: Self-pay | Admitting: *Deleted

## 2016-08-01 ENCOUNTER — Emergency Department (HOSPITAL_BASED_OUTPATIENT_CLINIC_OR_DEPARTMENT_OTHER)
Admission: EM | Admit: 2016-08-01 | Discharge: 2016-08-02 | Disposition: A | Discharge status: Home / Self Care | Payer: Self-pay | Attending: Emergency Medicine | Admitting: Emergency Medicine

## 2016-08-01 DIAGNOSIS — H60502 Unspecified acute noninfective otitis externa, left ear: Secondary | ICD-10-CM | POA: Insufficient documentation

## 2016-08-01 MED ORDER — IBUPROFEN 400 MG PO TABS
600.0000 mg | ORAL_TABLET | Freq: Once | ORAL | Status: AC
  Administered 2016-08-01: 600 mg via ORAL
  Filled 2016-08-01: qty 1

## 2016-08-01 MED ORDER — NEOMYCIN-COLIST-HC-THONZONIUM 3.3-3-10-0.5 MG/ML OT SUSP
3.0000 [drp] | Freq: Once | OTIC | Status: DC
  Filled 2016-08-01: qty 5

## 2016-08-01 MED ORDER — CIPROFLOXACIN-DEXAMETHASONE 0.3-0.1 % OT SUSP
OTIC | Status: AC
  Administered 2016-08-01: 3 [drp]
  Filled 2016-08-01: qty 7.5

## 2016-08-01 MED ORDER — NEOMYCIN-POLYMYXIN-HC 3.5-10000-1 OT SUSP: 4.0000 [drp] | Freq: Four times a day (QID) | OTIC | 0 refills | Status: DC

## 2016-08-01 NOTE — ED Provider Notes (Signed)
[facility] Note   CSN: [id number] Arrival date & time: 08/01/16  2045  By signing my name below, I, Philip Burke, attest that this documentation has been prepared under the direction and in the presence of  [name], PA-C. Electronically Signed: Doreatha Burke, ED Scribe. 08/01/16. 10:46 PM.    History   Chief Complaint Chief Complaint  Patient presents with  . Otalgia    HPI Philip Burke is a 35 y.o. male who presents to the Emergency Department complaining of moderate, intermittent left ear pain with radiation to the left jaw onset yesterday. Pt states he only feels pain with jaw movement and chewing. No alleviating factors noted. No h/o of similar symptoms. He denies sore throat, fever, vomiting, abdominal pain, difficulty swallowing or tolerating secretions, ear discharge.   The history is provided by the patient. No language interpreter was used.    History reviewed. No pertinent past medical history.  There are no active problems to display for this patient.   History reviewed. No pertinent surgical history.     Home Medications    Prior to Admission medications   Medication Sig Start Date End Date Taking? Authorizing Provider  benzonatate (TESSALON) 100 MG capsule Take 1 capsule (100 mg total) by mouth every 8 (eight) hours. 11/20/15   Chase Picket Ward, PA-C  HYDROcodone-acetaminophen (NORCO/VICODIN) 5-325 MG per tablet Take 2 tablets by mouth every 4 (four) hours as needed for moderate pain or severe pain. 04/30/14   Kaitlyn Szekalski, PA-C  ibuprofen (ADVIL,MOTRIN) 800 MG tablet Take 1 tablet (800 mg total) by mouth every 8 (eight) hours as needed for fever. 11/20/15   Chase Picket Ward, PA-C  neomycin-polymyxin-hydrocortisone (CORTISPORIN) 3.5-10000-1 otic suspension Place 4 drops into the left ear 4 (four) times daily. 08/01/16   Jerre Simon, PA    Family History No family history on file.  Social History Social History    Substance Use Topics  . Smoking status: Never Smoker  . Smokeless tobacco: Never Used  . Alcohol use No     Allergies   Review of patient's allergies indicates no known allergies.   Review of Systems Review of Systems  Constitutional: Negative for fever.  HENT: Positive for ear pain. Negative for ear discharge, sore throat and trouble swallowing.   Gastrointestinal: Negative for abdominal pain and vomiting.     Physical Exam Updated Vital Signs BP 124/62   Pulse 70   Temp 98 F (36.7 C) (Oral)   Resp 18   Ht 5\' 7"  (1.702 m)   Wt 120 lb (54.4 kg)   SpO2 100%   BMI 18.79 kg/m   Physical Exam  Constitutional: He appears well-developed and well-nourished. No distress.  HENT:  Head: Normocephalic and atraumatic.  Right Ear: Hearing, tympanic membrane, external ear and ear canal normal.  Left Ear: Hearing normal.  Mouth/Throat: Uvula is midline, oropharynx is clear and moist and mucous membranes are normal. No oropharyngeal exudate, posterior oropharyngeal edema or posterior oropharyngeal erythema.  Left TM cerumen impaction. TTP over the left TMJ. No malalignment appreciated.   After cerumen removal; Left TM still unable to be visualized. Left ear canal edematous with mild surrounding erythema and waxy discharge.   Eyes: Conjunctivae are normal.  Pulmonary/Chest: Effort normal. No respiratory distress.  Musculoskeletal: Normal range of motion.  Neurological: He is alert. Coordination normal.  Skin: Skin is warm and dry. He is not diaphoretic.  Psychiatric: He has a normal mood and affect. His behavior is normal.  Nursing note and vitals reviewed.    ED Treatments / Results   DIAGNOSTIC STUDIES: Oxygen Saturation is 100% on RA, normal by my interpretation.    COORDINATION OF CARE: 10:43 PM Discussed treatment plan with pt at bedside which includes cerumen removal and pt agreed to plan.    Labs (all labs ordered are listed, but only abnormal results are  displayed) Labs Reviewed - No data to display  EKG  EKG Interpretation None       Radiology No results found.  Procedures Procedures (including critical care time)  Medications Ordered in ED Medications  neomycin-colistin-hydrocortisone-thonzonium (CORTISPORIN TC) otic suspension 3 drop (3 drops Left Ear Not Given 08/01/16 2355)  ibuprofen (ADVIL,MOTRIN) tablet 600 mg (600 mg Oral Given 08/01/16 2356)  ciprofloxacin-dexamethasone (CIPRODEX) 0.3-0.1 % otic suspension (3 drops  Given 08/01/16 2356)     Initial Impression / Assessment and Plan / ED Course  I have reviewed the triage vital signs and the nursing notes.  Pertinent labs & imaging results that were available during my care of the patient were reviewed by me and considered in my medical decision making (see chart for details).  Clinical Course     Final Clinical Impressions(s) / ED Diagnoses   Final diagnoses:  Acute otitis externa of left ear, unspecified type    Pt presenting with otitis externa. Pt afebrile in NAD. Cerumen removed from left ear canal in the ED. Exam not concerning for mastoiditis, cellulitis or malignant OE. Discharge with Cortisporin TC.  Advised follow up with PCP in 2-3 days To have ear reevaluated.  Return precautions discussed. Pt appears safe for discharge.     New Prescriptions New Prescriptions   NEOMYCIN-POLYMYXIN-HYDROCORTISONE (CORTISPORIN) 3.5-10000-1 OTIC SUSPENSION    Place 4 drops into the left ear 4 (four) times daily.    I personally performed the services described in this documentation, which was scribed in my presence. The recorded information has been reviewed and is accurate.      Jerre SimonJessica L Declan Adamson, PA 08/02/16 0011    Doug SouSam Jacubowitz, MD 08/02/16 [id number]

## 2016-08-01 NOTE — ED Triage Notes (Signed)
Left ear pain since yesterday

## 2016-08-01 NOTE — Discharge Instructions (Signed)
Use the drops as prescribed, 4 drops in your left ear 4 times daily (every 6 hours). Follow-up with the [facility] or the emergency department in 2-3 days to have your ear reevaluated. Return sooner to the emergency department if you experience worsening pain, headaches, fever, inability to open her mouth, or any other concerning symptoms.

## 2016-08-01 NOTE — ED Notes (Signed)
L ear cleaned per PA order.. Pt. Tolerated well.  Lots of wax removed from the L ear.

## 2019-05-02 ENCOUNTER — Emergency Department (HOSPITAL_BASED_OUTPATIENT_CLINIC_OR_DEPARTMENT_OTHER)
Admission: EM | Admit: 2019-05-02 | Discharge: 2019-05-02 | Disposition: A | Discharge status: Home / Self Care | Payer: Self-pay | Attending: Emergency Medicine | Admitting: Emergency Medicine

## 2019-05-02 ENCOUNTER — Encounter (HOSPITAL_BASED_OUTPATIENT_CLINIC_OR_DEPARTMENT_OTHER): Payer: Self-pay | Admitting: Emergency Medicine

## 2019-05-02 ENCOUNTER — Other Ambulatory Visit: Payer: Self-pay

## 2019-05-02 DIAGNOSIS — Y999 Unspecified external cause status: Secondary | ICD-10-CM | POA: Insufficient documentation

## 2019-05-02 DIAGNOSIS — S0181XA Laceration without foreign body of other part of head, initial encounter: Secondary | ICD-10-CM | POA: Insufficient documentation

## 2019-05-02 DIAGNOSIS — W2209XA Striking against other stationary object, initial encounter: Secondary | ICD-10-CM | POA: Insufficient documentation

## 2019-05-02 DIAGNOSIS — Y929 Unspecified place or not applicable: Secondary | ICD-10-CM | POA: Insufficient documentation

## 2019-05-02 DIAGNOSIS — Y939 Activity, unspecified: Secondary | ICD-10-CM | POA: Insufficient documentation

## 2019-05-02 MED ORDER — IBUPROFEN 800 MG PO TABS
800.0000 mg | ORAL_TABLET | Freq: Once | ORAL | Status: AC
  Administered 2019-05-02: 800 mg via ORAL
  Filled 2019-05-02: qty 1

## 2019-05-02 MED ORDER — LIDOCAINE HCL (PF) 1 % IJ SOLN
10.0000 mL | Freq: Once | INTRAMUSCULAR | Status: AC
  Administered 2019-05-02: 10 mL
  Filled 2019-05-02: qty 10

## 2019-05-02 MED ORDER — IBUPROFEN 800 MG PO TABS: 800.0000 mg | ORAL_TABLET | Freq: Three times a day (TID) | ORAL | 0 refills | Status: DC | PRN

## 2019-05-02 NOTE — ED Triage Notes (Signed)
Laceration to center of forehead. He ran into a door.

## 2019-05-02 NOTE — ED Notes (Signed)
Pt. Received sutures for laceration to forehead between eyes after he hit this area on his car door today with controlled bleeding.

## 2019-05-02 NOTE — ED Provider Notes (Signed)
[facility] EMERGENCY DEPARTMENT Provider Note   CSN: [id number] Arrival date & time: 05/02/19  1557    History   Chief Complaint Chief Complaint  Patient presents with  . Head Injury    HPI Philip Burke is a 38 y.o. male without significant PMH who presents with a forehead laceration after hitting his head on the driver side door of his car.  He said that he just ran into the door and had no preceding symptoms causing him to lose balance.  He did not lose consciousness when he had his injury.  He has mild pain around the affected area currently but has no other symptoms, including nausea or chest pain.  He takes no medications.  His last tetanus shot was 2 years ago.     History reviewed. No pertinent past medical history.  There are no active problems to display for this patient.   History reviewed. No pertinent surgical history.      Home Medications    Prior to Admission medications   Medication Sig Start Date End Date Taking? Authorizing Provider  benzonatate (TESSALON) 100 MG capsule Take 1 capsule (100 mg total) by mouth every 8 (eight) hours. 11/20/15   Ward, Chase PicketJaime Pilcher, PA-C  HYDROcodone-acetaminophen (NORCO/VICODIN) 5-325 MG per tablet Take 2 tablets by mouth every 4 (four) hours as needed for moderate pain or severe pain. 04/30/14   Szekalski, Kaitlyn, PA-C  ibuprofen (ADVIL) 800 MG tablet Take 1 tablet (800 mg total) by mouth every 8 (eight) hours as needed. 05/02/19   Lennox SoldersWinfrey, Verdean Murin C, MD  ibuprofen (ADVIL,MOTRIN) 800 MG tablet Take 1 tablet (800 mg total) by mouth every 8 (eight) hours as needed for fever. 11/20/15   Ward, Chase PicketJaime Pilcher, PA-C  neomycin-polymyxin-hydrocortisone (CORTISPORIN) 3.5-10000-1 otic suspension Place 4 drops into the left ear 4 (four) times daily. 08/01/16   Jerre SimonFocht, Jessica L, PA    Family History No family history on file.  Social History Social History   Tobacco Use  . Smoking status: Never Smoker  . Smokeless tobacco:  Never Used  Substance Use Topics  . Alcohol use: No  . Drug use: No     Allergies   Patient has no known allergies.   Review of Systems Review of Systems  Constitutional: Negative for activity change, appetite change and fever.  HENT: Negative for congestion.   Respiratory: Negative for cough and shortness of breath.   Cardiovascular: Negative for chest pain.  Gastrointestinal: Negative for abdominal pain and nausea.  Genitourinary: Negative for dysuria.  Musculoskeletal: Negative for arthralgias.  Neurological: Negative for dizziness and syncope.  Psychiatric/Behavioral: Negative for confusion.     Physical Exam Updated Vital Signs BP (!) 140/91 (BP Location: Right Arm)   Pulse 76   Temp 98.1 F (36.7 C) (Oral)   Resp 18   Ht 5\' 6"  (1.676 m)   Wt 54.4 kg   SpO2 98%   BMI 19.37 kg/m   Physical Exam Constitutional:      General: He is not in acute distress.    Appearance: Normal appearance.  HENT:     Head:     Comments: Laceration on lower forehead between eyes, oozing blood, no foreign bodies visualized    Right Ear: External ear normal.     Left Ear: External ear normal.     Nose: Nose normal.     Mouth/Throat:     Mouth: Mucous membranes are moist.  Eyes:     Extraocular Movements: Extraocular movements intact.  Neck:     Musculoskeletal: Normal range of motion.  Cardiovascular:     Rate and Rhythm: Normal rate and regular rhythm.  Pulmonary:     Effort: Pulmonary effort is normal.     Breath sounds: Stridor present.  Abdominal:     General: Abdomen is flat.     Palpations: Abdomen is soft.  Musculoskeletal: Normal range of motion.  Skin:    General: Skin is warm and dry.  Neurological:     General: No focal deficit present.     Mental Status: He is alert and oriented to person, place, and time.  Psychiatric:        Mood and Affect: Mood normal.        Behavior: Behavior normal.      ED Treatments / Results  Labs (all labs ordered are  listed, but only abnormal results are displayed) Labs Reviewed - No data to display  EKG None  Radiology No results found.  Procedures .Marland Kitchen.Laceration Repair  Date/Time: 05/02/2019 5:06 PM Performed by: Lennox SoldersWinfrey, Francely Craw C, MD Authorized by: Maia PlanLong, Joshua G, MD   Consent:    Consent obtained:  Verbal   Consent given by:  Patient   Risks discussed:  Infection and pain   Alternatives discussed:  Observation Anesthesia (see MAR for exact dosages):    Anesthesia method:  Local infiltration   Local anesthetic:  Lidocaine 1% w/o epi Laceration details:    Location:  Face   Face location:  Forehead   Length (cm):  3 Repair type:    Repair type:  Simple Pre-procedure details:    Preparation:  Patient was prepped and draped in usual sterile fashion Exploration:    Wound exploration: entire depth of wound probed and visualized     Wound extent: no underlying fracture noted     Contaminated: no   Treatment:    Area cleansed with:  Saline   Amount of cleaning:  Standard   Irrigation solution:  Sterile saline   Visualized foreign bodies/material removed: no   Skin repair:    Repair method:  Sutures Approximation:    Approximation:  Close Post-procedure details:    Dressing:  Open (no dressing)   Patient tolerance of procedure:  Tolerated well, no immediate complications   (including critical care time)  Medications Ordered in ED Medications  ibuprofen (ADVIL) tablet 800 mg (800 mg Oral Given 05/02/19 1624)  lidocaine (PF) (XYLOCAINE) 1 % injection 10 mL (10 mLs Infiltration Given 05/02/19 1624)     Initial Impression / Assessment and Plan / ED Course  I have reviewed the triage vital signs and the nursing notes.  Pertinent labs & imaging results that were available during my care of the patient were reviewed by me and considered in my medical decision making (see chart for details).        Since patient did not lose consciousness and laceration did not go to bone, CT head  was determined not to be necessary. Laceration was repaired with simple interrupted sutures.  Patient tolerated the procedure well.  Patient was given return precautions, including increased erythema, pain, and swelling around the laceration.  He was advised to return here or go to urgent care for removal of his sutures in about 7 days.  He was provided with a work excuse through the rest of the week and ibuprofen was sent to his pharmacy for pain relief.  Final Clinical Impressions(s) / ED Diagnoses   Final diagnoses:  Facial laceration, initial encounter  ED Discharge Orders         Ordered    ibuprofen (ADVIL) 800 MG tablet  Every 8 hours PRN     05/02/19 1701           Kathrene Alu, MD 05/02/19 1747    Margette Fast, MD 05/03/19 1958

## 2019-05-02 NOTE — Discharge Instructions (Signed)
Please go to an urgent care or come back to [facility] in about 7 days to have your sutures removed.  Please seek medical care if your wound becomes more painful, red, and swollen.  We have provided a work excuse note for you and sent ibuprofen to your pharmacy for pain.

## 2019-05-20 ENCOUNTER — Emergency Department (HOSPITAL_BASED_OUTPATIENT_CLINIC_OR_DEPARTMENT_OTHER)
Admission: EM | Admit: 2019-05-20 | Discharge: 2019-05-20 | Disposition: A | Discharge status: Home / Self Care | Payer: Self-pay | Attending: Emergency Medicine | Admitting: Emergency Medicine

## 2019-05-20 ENCOUNTER — Other Ambulatory Visit: Payer: Self-pay

## 2019-05-20 ENCOUNTER — Encounter (HOSPITAL_BASED_OUTPATIENT_CLINIC_OR_DEPARTMENT_OTHER): Payer: Self-pay | Admitting: *Deleted

## 2019-05-20 DIAGNOSIS — Z4802 Encounter for removal of sutures: Secondary | ICD-10-CM

## 2019-05-20 DIAGNOSIS — X58XXXD Exposure to other specified factors, subsequent encounter: Secondary | ICD-10-CM | POA: Insufficient documentation

## 2019-05-20 DIAGNOSIS — S0181XD Laceration without foreign body of other part of head, subsequent encounter: Secondary | ICD-10-CM | POA: Insufficient documentation

## 2019-05-20 NOTE — ED Notes (Signed)
Pt. Is here for suture removal..Marland KitchenSutures are clean and dry edges of wound are well approximated. No redness noted around the wound.

## 2019-05-20 NOTE — ED Provider Notes (Signed)
[facility] EMERGENCY DEPARTMENT Provider Note   CSN: [id number] Arrival date & time: 05/20/19  2231     History   Chief Complaint Chief Complaint  Patient presents with  . Suture / Staple Removal    HPI Dock Baccam is a 38 y.o. male.     HPI  This is a 38 year old male who presents for suture removal.  He was seen here on July 23.  He had sutures placed in the forehead.  Denies any complaints today and is simply here for suture removal.  Denies any fevers or redness noted.  Has noted that a few sutures have fallen out on their own.  History reviewed. No pertinent past medical history.  There are no active problems to display for this patient.   History reviewed. No pertinent surgical history.      Home Medications    Prior to Admission medications   Medication Sig Start Date End Date Taking? Authorizing Provider  benzonatate (TESSALON) 100 MG capsule Take 1 capsule (100 mg total) by mouth every 8 (eight) hours. 11/20/15   Ward, Ozella Almond, PA-C  HYDROcodone-acetaminophen (NORCO/VICODIN) 5-325 MG per tablet Take 2 tablets by mouth every 4 (four) hours as needed for moderate pain or severe pain. 04/30/14   Szekalski, Kaitlyn, PA-C  ibuprofen (ADVIL) 800 MG tablet Take 1 tablet (800 mg total) by mouth every 8 (eight) hours as needed. 05/02/19   Kathrene Alu, MD  ibuprofen (ADVIL,MOTRIN) 800 MG tablet Take 1 tablet (800 mg total) by mouth every 8 (eight) hours as needed for fever. 11/20/15   Ward, Ozella Almond, PA-C  neomycin-polymyxin-hydrocortisone (CORTISPORIN) 3.5-10000-1 otic suspension Place 4 drops into the left ear 4 (four) times daily. 08/01/16   Kalman Drape, PA    Family History No family history on file.  Social History Social History   Tobacco Use  . Smoking status: Never Smoker  . Smokeless tobacco: Never Used  Substance Use Topics  . Alcohol use: No  . Drug use: No     Allergies   Patient has no known allergies.   Review  of Systems Review of Systems  Constitutional: Negative for fever.  Skin: Positive for wound. Negative for color change.  All other systems reviewed and are negative.    Physical Exam Updated Vital Signs BP 119/76   Pulse 62   Temp 99 F (37.2 C) (Oral)   Resp 20   Ht 1.702 m (5\' 7" )   Wt 54.4 kg   SpO2 97%   BMI 18.78 kg/m   Physical Exam Vitals signs and nursing note reviewed.  Constitutional:      Appearance: He is well-developed.  HENT:     Head: Normocephalic.     Comments: Well-healing vertical wound between the eyebrows approximately 3 cm, well approximated, no adjacent erythema, 3 stitches in place Eyes:     Pupils: Pupils are equal, round, and reactive to light.  Cardiovascular:     Rate and Rhythm: Normal rate and regular rhythm.  Pulmonary:     Effort: Pulmonary effort is normal. No respiratory distress.  Skin:    General: Skin is warm and dry.     Comments: See HEENT  Neurological:     Mental Status: He is alert and oriented to person, place, and time.  Psychiatric:        Mood and Affect: Mood normal.      ED Treatments / Results  Labs (all labs ordered are listed, but only abnormal results  are displayed) Labs Reviewed - No data to display  EKG None  Radiology No results found.  Procedures .Suture Removal  Date/Time: 05/20/2019 11:29 PM Performed by: Shon BatonHorton,  F, MD Authorized by: Shon BatonHorton,  F, MD   Consent:    Consent obtained:  Verbal   Consent given by:  Patient   Risks discussed:  Pain and wound separation   Alternatives discussed:  No treatment Location:    Location:  Head/neck   Head/neck location:  Forehead Procedure details:    Wound appearance:  No signs of infection   Number of sutures removed:  3 Post-procedure details:    Post-removal:  Antibiotic ointment applied and dressing applied   Patient tolerance of procedure:  Tolerated well, no immediate complications   (including critical care time)   Medications Ordered in ED Medications - No data to display   Initial Impression / Assessment and Plan / ED Course  I have reviewed the triage vital signs and the nursing notes.  Pertinent labs & imaging results that were available during my care of the patient were reviewed by me and considered in my medical decision making (see chart for details).        Patient presents with request for suture removal.  Sutures removed without any difficulty.  Wound appears well-healing.  Dressing applied.  Continue wound care.  After history, exam, and medical workup I feel the patient has been appropriately medically screened and is safe for discharge home. Pertinent diagnoses were discussed with the patient. Patient was given return precautions.   Final Clinical Impressions(s) / ED Diagnoses   Final diagnoses:  Visit for suture removal    ED Discharge Orders    None       Shon BatonHorton,  F, MD 05/20/19 2330

## 2019-05-20 NOTE — ED Triage Notes (Signed)
Here for suture removal from his forehead.  

## 2019-05-20 NOTE — Discharge Instructions (Addendum)
Continue wound care with bacitracin.

## 2021-03-19 ENCOUNTER — Emergency Department (HOSPITAL_BASED_OUTPATIENT_CLINIC_OR_DEPARTMENT_OTHER): Payer: Self-pay

## 2021-03-19 ENCOUNTER — Encounter (HOSPITAL_BASED_OUTPATIENT_CLINIC_OR_DEPARTMENT_OTHER): Payer: Self-pay | Admitting: Emergency Medicine

## 2021-03-19 ENCOUNTER — Emergency Department (HOSPITAL_BASED_OUTPATIENT_CLINIC_OR_DEPARTMENT_OTHER)
Admission: EM | Admit: 2021-03-19 | Discharge: 2021-03-20 | Disposition: A | Discharge status: Home / Self Care | Payer: Self-pay | Attending: Emergency Medicine | Admitting: Emergency Medicine

## 2021-03-19 ENCOUNTER — Other Ambulatory Visit: Payer: Self-pay

## 2021-03-19 DIAGNOSIS — S62609A Fracture of unspecified phalanx of unspecified finger, initial encounter for closed fracture: Secondary | ICD-10-CM

## 2021-03-19 DIAGNOSIS — S63284A Dislocation of proximal interphalangeal joint of right ring finger, initial encounter: Secondary | ICD-10-CM | POA: Insufficient documentation

## 2021-03-19 DIAGNOSIS — S62624A Displaced fracture of medial phalanx of right ring finger, initial encounter for closed fracture: Secondary | ICD-10-CM | POA: Insufficient documentation

## 2021-03-19 DIAGNOSIS — W2109XA Struck by other hit or thrown ball, initial encounter: Secondary | ICD-10-CM | POA: Insufficient documentation

## 2021-03-19 DIAGNOSIS — Y9379 Activity, other specified sports and athletics: Secondary | ICD-10-CM | POA: Insufficient documentation

## 2021-03-19 NOTE — ED Triage Notes (Signed)
Patient arrived via POV c/o right ring finger pain and swelling x 2 days. Patient states struck by ball while participating in sports. Patient states 6/10 pain with obvious swelling. Patient able to move finger. Patient is AO x 4, VS WDL< normal gait.

## 2021-03-20 ENCOUNTER — Emergency Department (HOSPITAL_BASED_OUTPATIENT_CLINIC_OR_DEPARTMENT_OTHER): Payer: Self-pay

## 2021-03-20 MED ORDER — NAPROXEN 375 MG PO TABS: ORAL_TABLET | ORAL | 0 refills | Status: DC

## 2021-03-20 MED ORDER — BUPIVACAINE HCL 0.5 % IJ SOLN
50.0000 mL | Freq: Once | INTRAMUSCULAR | Status: AC
  Administered 2021-03-20: 50 mL

## 2021-03-20 NOTE — ED Provider Notes (Signed)
[facility] Provider Note: Philip Dell, MD, FACEP  CSN: [id number] MRN: [id number] ARRIVAL: 03/19/21 at 2309 ROOM: MH05/MH05   CHIEF COMPLAINT  Finger Injury   HISTORY OF PRESENT ILLNESS  03/20/21 12:53 AM Philip Burke is a 40 y.o. male who was struck on his right ring finger 3 days ago by a ball while playing sports.  He has a deformity to that finger along with pain which she rates as a 6 out of 10.  He describes the pain is throbbing and it is worse with palpation or attempted movement.   History reviewed. No pertinent past medical history.  History reviewed. No pertinent surgical history.  History reviewed. No pertinent family history.  Social History   Tobacco Use   Smoking status: Never   Smokeless tobacco: Never  Vaping Use   Vaping Use: Never used  Substance Use Topics   Alcohol use: No   Drug use: No    Prior to Admission medications   Medication Sig Start Date End Date Taking? Authorizing Provider  naproxen (NAPROSYN) 375 MG tablet Take 1 tablet twice daily as needed for pain. 03/20/21  Yes Philip Pytel, MD    Allergies Patient has no known allergies.   REVIEW OF SYSTEMS  Negative except as noted here or in the History of Present Illness.   PHYSICAL EXAMINATION  Initial Vital Signs Blood pressure 122/82, pulse (!) 59, temperature 98.5 F (36.9 C), temperature source Oral, resp. rate 16, height 5\' 7"  (1.702 m), weight 54.4 kg, SpO2 100 %.  Examination General: Well-developed, well-nourished male in no acute distress; appearance consistent with age of record HENT: normocephalic; atraumatic Eyes: Normal appearance Neck: supple Heart: regular rate and rhythm Lungs: clear to auscultation bilaterally Abdomen: soft; nondistended; nontender; bowel sounds present Extremities: Deformity and decreased range of motion of PIP joint of right ring finger with tenderness, distally neurovascularly intact:    Neurologic: Awake, alert and  oriented; motor function intact in all extremities and symmetric; no facial droop Skin: Warm and dry Psychiatric: Normal mood and affect   RESULTS  Summary of this visit's results, reviewed and interpreted by myself:   EKG Interpretation  Date/Time:    Ventricular Rate:    PR Interval:    QRS Duration:   QT Interval:    QTC Calculation:   R Axis:     Text Interpretation:          Laboratory Studies: No results found for this or any previous visit (from the past 24 hour(s)). Imaging Studies: DG Finger Ring Right  Result Date: 03/20/2021 CLINICAL DATA:  Postreduction attempt EXAM: RIGHT RING FINGER 2+V COMPARISON:  03/19/2021 FINDINGS: Continued posterior dislocation of the right ring finger PIP joint. Displaced bone fragments noted off the anterior inferior corner of the middle phalanx. IMPRESSION: Continued posterior dislocation of the right ring finger PIP joint. Anterior inferior corner fracture of the middle phalanx. Electronically Signed   By: 05/19/2021 M.D.   On: 03/20/2021 01:55   DG Finger Ring Right  Result Date: 03/20/2021 CLINICAL DATA:  Finger injury, pain EXAM: RIGHT RING FINGER 2+V COMPARISON:  None. FINDINGS: There is posterior dislocation at right ring finger PIP joint. Bone fragment is noted anteriorly from the anterior corner of the middle phalanx. Soft tissue swelling. IMPRESSION: Fracture dislocation of the right ring finger PIP joint as above. Electronically Signed   By: 05/20/2021 M.D.   On: 03/20/2021 00:11    ED COURSE and MDM  Nursing notes, initial  and subsequent vitals signs, including pulse oximetry, reviewed and interpreted by myself.  Vitals:   03/19/21 2320 03/19/21 2321  BP: 122/82   Pulse: (!) 59   Resp: 16   Temp: 98.5 F (36.9 C)   TempSrc: Oral   SpO2: 100%   Weight:  54.4 kg  Height:  5\' 7"  (1.702 m)   Medications  bupivacaine (MARCAINE) 0.5 % (with pres) injection 50 mL (50 mLs Infiltration Given by Other 03/20/21 0124)     Attempt was made to reduce the patient's dislocation but this was not successful.  This is likely due at least in part to swelling caused by the patient's 3-day delay and seeking care.  We will refer him to Dr. 05/20/21 of hand surgery for definitive treatment.  PROCEDURES  .Ortho Injury Treatment  Date/Time: 03/20/2021 1:59 AM Performed by: Philip Route, MD Authorized by: Philip Sawtelle, MD   Consent:    Consent obtained:  Verbal   Consent given by:  Patient   Risks discussed:  Irreducible dislocation   Alternatives discussed:  ReferralInjury location: finger Location details: right ring finger Injury type: fracture-dislocation Fracture type: middle phalanx MCP joint involved: no Any IP joint involved: yes Pre-procedure neurovascular assessment: neurovascularly intact Pre-procedure distal perfusion: normal Pre-procedure neurological function: normal Pre-procedure range of motion: reduced Anesthesia: digital block  Anesthesia: Local anesthesia used: yes Local Anesthetic: bupivacaine 0.5% without epinephrine Anesthetic total: 4 mL  Patient sedated: NoManipulation performed: yes Skeletal traction used: yes Reduction successful: no X-ray confirmed reduction: yes Immobilization: splint Splint type: static finger Splint Applied by: ED Tech Supplies used: aluminum splint Post-procedure neurovascular assessment: post-procedure neurovascularly intact Post-procedure distal perfusion: normal Post-procedure neurological function: normal Post-procedure range of motion: unchanged Comments: Will refer to hand surgeon for definitive treatment.  I suspect reduction was unsuccessful partly due to swelling caused by a 3-day delay in seeking care.   ED DIAGNOSES     ICD-10-CM   1. Closed fracture dislocation of finger, initial encounter  S62.609A          Philip Burke, 05/20/2021, MD 03/20/21 [phone]

## 2021-04-09 ENCOUNTER — Other Ambulatory Visit: Payer: Self-pay

## 2021-04-09 ENCOUNTER — Encounter (HOSPITAL_BASED_OUTPATIENT_CLINIC_OR_DEPARTMENT_OTHER): Payer: Self-pay

## 2021-04-09 ENCOUNTER — Emergency Department (HOSPITAL_BASED_OUTPATIENT_CLINIC_OR_DEPARTMENT_OTHER)
Admission: EM | Admit: 2021-04-09 | Discharge: 2021-04-10 | Disposition: A | Discharge status: Home / Self Care | Payer: Self-pay | Attending: Emergency Medicine | Admitting: Emergency Medicine

## 2021-04-09 DIAGNOSIS — K0889 Other specified disorders of teeth and supporting structures: Secondary | ICD-10-CM | POA: Insufficient documentation

## 2021-04-09 NOTE — ED Triage Notes (Signed)
Pt c/o left lower dental pain x 1 week-NAD-steady gait

## 2021-04-10 MED ORDER — BUPIVACAINE-EPINEPHRINE (PF) 0.5% -1:200000 IJ SOLN
1.8000 mL | Freq: Once | INTRAMUSCULAR | Status: AC
  Administered 2021-04-10: 1.8 mL
  Filled 2021-04-10: qty 1.8

## 2021-04-10 MED ORDER — AMOXICILLIN 500 MG PO CAPS
500.0000 mg | ORAL_CAPSULE | Freq: Once | ORAL | Status: AC
  Administered 2021-04-10: 500 mg via ORAL
  Filled 2021-04-10: qty 1

## 2021-04-10 MED ORDER — NAPROXEN 375 MG PO TABS: ORAL_TABLET | ORAL | 0 refills | Status: AC

## 2021-04-10 MED ORDER — OXYCODONE-ACETAMINOPHEN 5-325 MG PO TABS: 1.0000 | ORAL_TABLET | Freq: Four times a day (QID) | ORAL | 0 refills | Status: AC | PRN

## 2021-04-10 MED ORDER — AMOXICILLIN 500 MG PO CAPS: 500.0000 mg | ORAL_CAPSULE | Freq: Three times a day (TID) | ORAL | 0 refills | Status: AC

## 2021-04-10 NOTE — ED Provider Notes (Signed)
[facility] Provider Note: Philip Dell, MD, FACEP  CSN: [id number] MRN: [id number] ARRIVAL: 04/09/21 at 2226 ROOM: MHFT2/MHFT2   CHIEF COMPLAINT  Dental Pain   HISTORY OF PRESENT ILLNESS  04/10/21 12:38 AM Philip Burke is a 40 y.o. male with a 1 week history of pain associated with his left lower third molar.  There is a filling in that tooth.  The pain is now an 8 out of 10 and worse with eating or chewing.  The pain radiates to the left side of his face.  He has gotten transient relief with Goody powders.   History reviewed. No pertinent past medical history.  History reviewed. No pertinent surgical history.  No family history on file.  Social History   Tobacco Use   Smoking status: Never   Smokeless tobacco: Never  Vaping Use   Vaping Use: Never used  Substance Use Topics   Alcohol use: No   Drug use: No    Prior to Admission medications   Medication Sig Start Date End Date Taking? Authorizing Provider  amoxicillin (AMOXIL) 500 MG capsule Take 1 capsule (500 mg total) by mouth 3 (three) times daily. 04/10/21  Yes Katja Blue, MD  oxyCODONE-acetaminophen (PERCOCET) 5-325 MG tablet Take 1 tablet by mouth every 6 (six) hours as needed (for pain). 04/10/21  Yes Amit Leece, MD  naproxen (NAPROSYN) 375 MG tablet Take 1 tablet twice daily as needed for pain. 04/10/21   Harlene Petralia, [name], MD    Allergies Patient has no known allergies.   REVIEW OF SYSTEMS  Negative except as noted here or in the History of Present Illness.   PHYSICAL EXAMINATION  Initial Vital Signs Blood pressure 125/88, pulse 69, temperature 98.4 F (36.9 C), temperature source Oral, resp. rate 16, height 5\' 7"  (1.702 m), weight 59.9 kg, SpO2 98 %.  Examination General: Well-developed, well-nourished male in no acute distress; appearance consistent with age of record HENT: normocephalic; atraumatic; irregular looking amalgam filling in the left lower third molar Eyes: pupils equal, round  and reactive to light; extraocular muscles intact Neck: supple; no lymphadenopathy Heart: regular rate and rhythm Lungs: clear to auscultation bilaterally Abdomen: soft; nondistended Extremities: No deformity; full range of motion Neurologic: Awake, alert and oriented; motor function intact in all extremities and symmetric; no facial droop Skin: Warm and dry Psychiatric: Normal mood and affect   RESULTS  Summary of this visit's results, reviewed and interpreted by myself:   EKG Interpretation  Date/Time:    Ventricular Rate:    PR Interval:    QRS Duration:   QT Interval:    QTC Calculation:   R Axis:     Text Interpretation:          Laboratory Studies: No results found for this or any previous visit (from the past 24 hour(s)). Imaging Studies: No results found.  ED COURSE and MDM  Nursing notes, initial and subsequent vitals signs, including pulse oximetry, reviewed and interpreted by myself.  Vitals:   04/09/21 2233 04/09/21 2234  BP: 125/88   Pulse: 69   Resp: 16   Temp: 98.4 F (36.9 C)   TempSrc: Oral   SpO2: 98%   Weight:  59.9 kg  Height:  5\' 7"  (1.702 m)   Medications  bupivacaine-epinephrine (MARCAINE W/ EPI) 0.5% -1:200000 injection 1.8 mL (has no administration in time range)  amoxicillin (AMOXIL) capsule 500 mg (has no administration in time range)    I suspect the irregular looking filling in the  patient's left lower third molar has allowed decayed into the tooth.  We will treat with amoxicillin and analgesics and refer to dentistry.  PROCEDURES  Procedures DENTAL BLOCK 1.8 milliliters of 0.5% bupivacaine with epinephrine were injected into the buccal fold adjacent to the left lower third molar. The patient tolerated this well and there were no immediate complications. Adequate analgesia was obtained.   ED DIAGNOSES     ICD-10-CM   1. Pain, dental  K08.89          Paula Libra, MD 04/10/21 [phone]

## 2022-12-11 ENCOUNTER — Emergency Department (HOSPITAL_BASED_OUTPATIENT_CLINIC_OR_DEPARTMENT_OTHER)
Admission: EM | Admit: 2022-12-11 | Discharge: 2022-12-11 | Disposition: A | Discharge status: Home / Self Care | Payer: 59 | Attending: Emergency Medicine | Admitting: Emergency Medicine

## 2022-12-11 ENCOUNTER — Other Ambulatory Visit: Payer: Self-pay

## 2022-12-11 ENCOUNTER — Encounter (HOSPITAL_BASED_OUTPATIENT_CLINIC_OR_DEPARTMENT_OTHER): Payer: Self-pay

## 2022-12-11 DIAGNOSIS — R21 Rash and other nonspecific skin eruption: Secondary | ICD-10-CM | POA: Diagnosis present

## 2022-12-11 MED ORDER — HYDROXYZINE HCL 25 MG PO TABS: 25.0000 mg | ORAL_TABLET | Freq: Four times a day (QID) | ORAL | 0 refills | Status: AC

## 2022-12-11 MED ORDER — CLOTRIMAZOLE 1 % EX CREA: TOPICAL_CREAM | CUTANEOUS | 0 refills | Status: AC

## 2022-12-11 MED ORDER — PREDNISONE 50 MG PO TABS: 60.0000 mg | ORAL_TABLET | Freq: Once | ORAL | Status: DC

## 2022-12-11 NOTE — ED Triage Notes (Signed)
Pt came in with complaint of extremely itchy rash that has spread across chest and legs over past week. Spots are large/red/and circular in nature.

## 2022-12-11 NOTE — Discharge Instructions (Signed)
I suspect he might have a fungal rash.  Use lamotrigine cream twice daily to your rash.  Take hydroxyzine to help with itching and sleep.  You can also consider taking some Benadryl as well.

## 2022-12-11 NOTE — ED Provider Notes (Signed)
[facility] Provider Note   CSN: [id number] Arrival date & time: 12/11/22  2040     History  Chief Complaint  Patient presents with   Rash    Philip Burke is a 42 y.o. male.  Patient here with rash on abdomen, axilla, legs.  Has been using some over-the-counter the cream without much relief.  He is very itchy.  Denies any new medications.  Denies any new exposures.  No breathing issues.  The history is provided by the patient.       Home Medications Prior to Admission medications   Medication Sig Start Date End Date Taking? Authorizing Provider  clotrimazole (LOTRIMIN) 1 % cream Apply to affected area 2 times daily 12/11/22  Yes Brandun Pinn, DO  hydrOXYzine (ATARAX) 25 MG tablet Take 1 tablet (25 mg total) by mouth every 6 (six) hours. 12/11/22  Yes Shonica Weier, DO  amoxicillin (AMOXIL) 500 MG capsule Take 1 capsule (500 mg total) by mouth 3 (three) times daily. 04/10/21   [name], [name], MD  naproxen (NAPROSYN) 375 MG tablet Take 1 tablet twice daily as needed for pain. 04/10/21   [name], [name], MD  oxyCODONE-acetaminophen (PERCOCET) 5-325 MG tablet Take 1 tablet by mouth every 6 (six) hours as needed (for pain). 04/10/21   [name], Jenny Reichmann, MD      Allergies    Patient has no known allergies.    Review of Systems   Review of Systems  Physical Exam Updated Vital Signs BP (!) 136/92   Pulse 96   Temp 99.1 F (37.3 C) (Oral)   Resp 17   Ht '5\' 6"'$  (1.676 m)   Wt 59 kg   SpO2 99%   BMI 20.98 kg/m  Physical Exam Vitals and nursing note reviewed.  Constitutional:      General: He is not in acute distress.    Appearance: He is well-developed.  HENT:     Head: Normocephalic and atraumatic.  Eyes:     Conjunctiva/sclera: Conjunctivae normal.  Cardiovascular:     Rate and Rhythm: Normal rate and regular rhythm.     Heart sounds: No murmur heard. Pulmonary:     Effort: Pulmonary effort is normal. No respiratory distress.      Breath sounds: Normal breath sounds.  Abdominal:     Palpations: Abdomen is soft.     Tenderness: There is no abdominal tenderness.  Musculoskeletal:        General: No swelling.     Cervical back: Neck supple.  Skin:    General: Skin is warm and dry.     Capillary Refill: Capillary refill takes less than 2 seconds.     Comments: Dried scaly rash to the left axilla, abdominal wall, upper legs  Neurological:     Mental Status: He is alert.  Psychiatric:        Mood and Affect: Mood normal.     ED Results / Procedures / Treatments   Labs (all labs ordered are listed, but only abnormal results are displayed) Labs Reviewed - No data to display  EKG None  Radiology No results found.  Procedures Procedures    Medications Ordered in ED Medications - No data to display  ED Course/ Medical Decision Making/ A&P                             Medical Decision Making Risk Prescription drug management.   Philip Burke  is here with rash.  Overall appears fungal in nature.  He has been using some eczema cream over-the-counter without much help.  Will put him on antifungal cream and Atarax and see how he does.  I have no concern for anaphylaxis or other emergent rash.  He understands return precautions.  Discharged in good condition.  This chart was dictated using voice recognition software.  Despite best efforts to proofread,  errors can occur which can change the documentation meaning.         Final Clinical Impression(s) / ED Diagnoses Final diagnoses:  Rash    Rx / DC Orders ED Discharge Orders          Ordered    hydrOXYzine (ATARAX) 25 MG tablet  Every 6 hours        12/11/22 2115    clotrimazole (LOTRIMIN) 1 % cream        12/11/22 2115              Lennice Sites, DO 12/11/22 2119

## 2023-01-22 IMAGING — CR DG FINGER RING 2+V*R*
3 series · 3 of 3 positions shown · non-contrast
Comparison: 03/19/2021

CLINICAL DATA: Postreduction attempt

EXAM:
RIGHT RING FINGER 2+V

[x finger pa right]
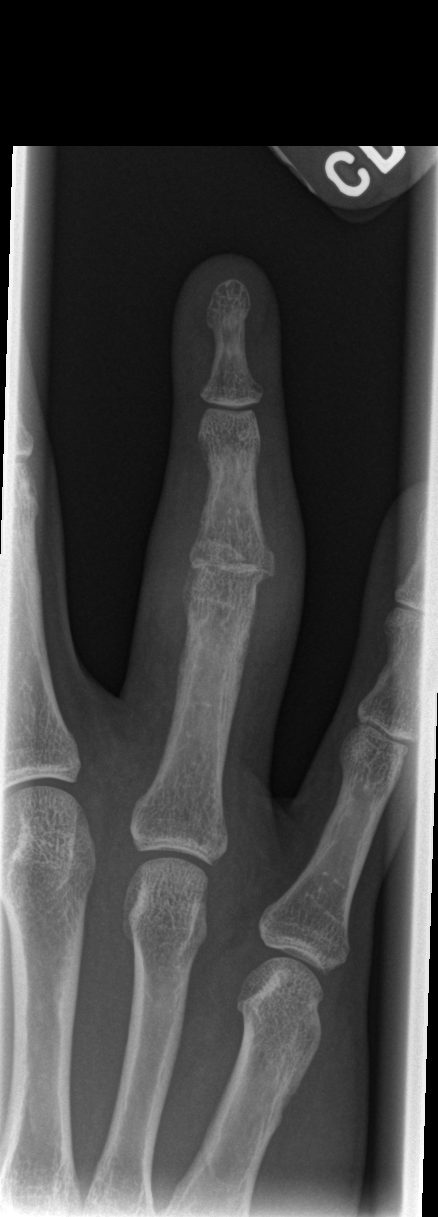

[x finger obl. right]
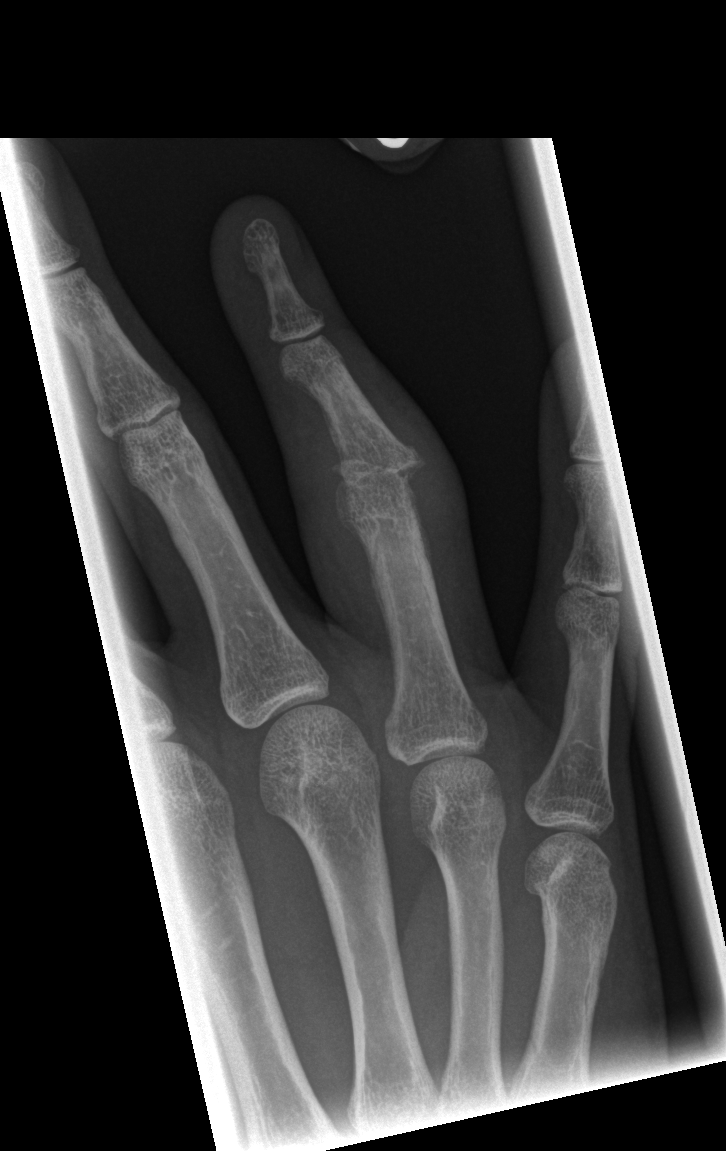

[x finger lateral right]
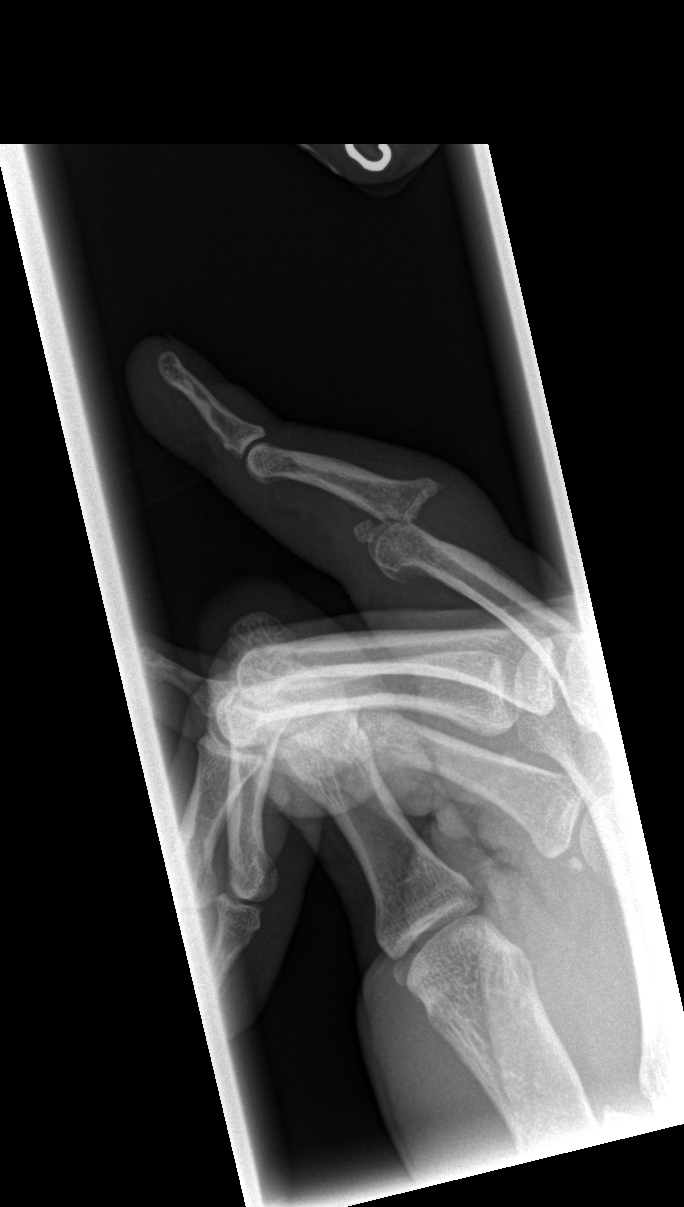

[3 of 3 positions shown; findings below may reference images not displayed]

FINDINGS: Continued posterior dislocation of the right ring finger PIP joint.
Displaced bone fragments noted off the anterior inferior corner of
the middle phalanx.
IMPRESSION: Continued posterior dislocation of the right ring finger PIP joint.
Anterior inferior corner fracture of the middle phalanx.

## 2023-06-12 ENCOUNTER — Encounter (HOSPITAL_BASED_OUTPATIENT_CLINIC_OR_DEPARTMENT_OTHER): Payer: Self-pay

## 2023-06-12 ENCOUNTER — Other Ambulatory Visit: Payer: Self-pay

## 2023-06-12 DIAGNOSIS — H6123 Impacted cerumen, bilateral: Secondary | ICD-10-CM | POA: Insufficient documentation

## 2023-06-12 NOTE — ED Triage Notes (Signed)
R sided ear pain x2 days

## 2023-06-13 ENCOUNTER — Emergency Department (HOSPITAL_BASED_OUTPATIENT_CLINIC_OR_DEPARTMENT_OTHER)
Admission: EM | Admit: 2023-06-13 | Discharge: 2023-06-13 | Disposition: A | Discharge status: Home / Self Care | Payer: 59 | Attending: Emergency Medicine | Admitting: Emergency Medicine

## 2023-06-13 DIAGNOSIS — H6121 Impacted cerumen, right ear: Secondary | ICD-10-CM

## 2023-06-13 MED ORDER — DOCUSATE SODIUM 100 MG PO CAPS
100.0000 mg | ORAL_CAPSULE | Freq: Once | ORAL | Status: AC
  Administered 2023-06-13: 100 mg via ORAL
  Filled 2023-06-13 (×2): qty 1

## 2023-06-13 MED ORDER — ACETAMINOPHEN 325 MG PO TABS
650.0000 mg | ORAL_TABLET | Freq: Once | ORAL | Status: AC
  Administered 2023-06-13: 650 mg via ORAL
  Filled 2023-06-13: qty 2

## 2023-06-13 NOTE — Discharge Instructions (Signed)
They sell solution over the counter that can help dissolve the ear wax.  Debrox is the most common one that I know of.  I have given you information to follow-up with the ear nose and throat doctor on-call if you so choose.  Take 4 over the counter ibuprofen tablets 3 times a day or 2 over-the-counter naproxen tablets twice a day for pain. Also take tylenol 1000mg (2 extra strength) four times a day.

## 2023-06-13 NOTE — ED Notes (Signed)
Discharge instructions reviewed with patient. Patient questions answered and opportunity for education reviewed. Patient voices understanding of discharge instructions with no further questions. Patient ambulatory with steady gait to lobby.  

## 2023-06-13 NOTE — ED Notes (Signed)
R Ear flushed multiple times.

## 2023-06-13 NOTE — ED Provider Notes (Signed)
[facility] Provider Note   CSN: [id number] Arrival date & time: 06/12/23  2041     History  Chief Complaint  Patient presents with   Otalgia    Philip Burke is a 42 y.o. male.  42 yo M with a chief complaints of right ear pain.  This been going on for a few days.  Worse when he chews.  Denies trauma to the ear.  Has been putting his finger in his ear quite a bit because he says it hurts.  Denies dental injury.   Otalgia      Home Medications Prior to Admission medications   Medication Sig Start Date End Date Taking? Authorizing Provider  amoxicillin (AMOXIL) 500 MG capsule Take 1 capsule (500 mg total) by mouth 3 (three) times daily. 04/10/21   [name], [name], MD  clotrimazole (LOTRIMIN) 1 % cream Apply to affected area 2 times daily 12/11/22   [name], DO  hydrOXYzine (ATARAX) 25 MG tablet Take 1 tablet (25 mg total) by mouth every 6 (six) hours. 12/11/22   [name], [name], DO  naproxen (NAPROSYN) 375 MG tablet Take 1 tablet twice daily as needed for pain. 04/10/21   [name], [name], MD  oxyCODONE-acetaminophen (PERCOCET) 5-325 MG tablet Take 1 tablet by mouth every 6 (six) hours as needed (for pain). 04/10/21   [name], [name], MD      Allergies    Patient has no known allergies.    Review of Systems   Review of Systems  HENT:  Positive for ear pain.     Physical Exam Updated Vital Signs BP 129/88   Pulse 68   Temp 97.9 F (36.6 C) (Oral)   Resp 19   Ht 5\' 6"  (1.676 m)   Wt 61.2 kg   SpO2 98%   BMI 21.79 kg/m  Physical Exam Vitals and nursing note reviewed.  Constitutional:      Appearance: He is well-developed.  HENT:     Head: Normocephalic and atraumatic.     Ears:     Comments: Wax impaction bilaterally    Mouth/Throat:     Comments: Good dentition diffusely, no obvious pain with percussion over his right teeth.  No sublingual swelling.  Tongue secretions without difficulty.  No obvious pain about the  TMJ. Eyes:     Pupils: Pupils are equal, round, and reactive to light.  Neck:     Vascular: No JVD.  Cardiovascular:     Rate and Rhythm: Normal rate and regular rhythm.     Heart sounds: No murmur heard.    No friction rub. No gallop.  Pulmonary:     Effort: No respiratory distress.     Breath sounds: No wheezing.  Abdominal:     General: There is no distension.     Tenderness: There is no abdominal tenderness. There is no guarding or rebound.  Musculoskeletal:        General: Normal range of motion.     Cervical back: Normal range of motion and neck supple.  Skin:    Coloration: Skin is not pale.     Findings: No rash.  Neurological:     Mental Status: He is alert and oriented to person, place, and time.  Psychiatric:        Behavior: Behavior normal.     ED Results / Procedures / Treatments   Labs (all labs ordered are listed, but only abnormal results are displayed) Labs Reviewed - No data to  display  EKG None  Radiology No results found.  Procedures Procedures    Medications Ordered in ED Medications  docusate sodium (COLACE) capsule 100 mg (100 mg Oral Given 06/13/23 0138)  acetaminophen (TYLENOL) tablet 650 mg (650 mg Oral Given 06/13/23 0222)    ED Course/ Medical Decision Making/ A&P                                 Medical Decision Making Risk OTC drugs.   42 yo M with a chief complaints of right ear pain.  Worse with chewing.  Not sure of the exact cause of this.  He does have a wax impaction bilaterally was attempted to be irrigated but without significant improvement.  Will have him try over-the-counter medicine to try and dissolve the wax.  Will give ENT follow-up.  Could be TMJ, could be dental in pathology though no obvious cause on exam found.  2:53 AM:  I have discussed the diagnosis/risks/treatment options with the patient.  Evaluation and diagnostic testing in the emergency department does not suggest an emergent condition requiring admission  or immediate intervention beyond what has been performed at this time.  They will follow up with PCP, ENT. We also discussed returning to the ED immediately if new or worsening sx occur. We discussed the sx which are most concerning (e.g., sudden worsening pain, fever, inability to tolerate by mouth) that necessitate immediate return. Medications administered to the patient during their visit and any new prescriptions provided to the patient are listed below.  Medications given during this visit Medications  docusate sodium (COLACE) capsule 100 mg (100 mg Oral Given 06/13/23 0138)  acetaminophen (TYLENOL) tablet 650 mg (650 mg Oral Given 06/13/23 0222)     The patient appears reasonably screen and/or stabilized for discharge and I doubt any other medical condition or other [facility] requiring further screening, evaluation, or treatment in the ED at this time prior to discharge.          Final Clinical Impression(s) / ED Diagnoses Final diagnoses:  Impacted cerumen of right ear    Rx / DC Orders ED Discharge Orders     None         [name], DO 06/13/23 1610
